# Patient Record
Sex: Male | Born: 1955 | Race: White | Hispanic: No | Marital: Married | Smoking: Never smoker
Health system: Southern US, Community
[De-identification: ages and names within clinical notes are randomized; demographics above are authoritative.]

## PROBLEM LIST (undated history)

## (undated) DIAGNOSIS — T7840XA Allergy, unspecified, initial encounter: Secondary | ICD-10-CM

## (undated) DIAGNOSIS — H409 Unspecified glaucoma: Secondary | ICD-10-CM

## (undated) DIAGNOSIS — C61 Malignant neoplasm of prostate: Secondary | ICD-10-CM

## (undated) DIAGNOSIS — E785 Hyperlipidemia, unspecified: Secondary | ICD-10-CM

## (undated) HISTORY — DX: Hyperlipidemia, unspecified: E78.5

## (undated) HISTORY — DX: Allergy, unspecified, initial encounter: T78.40XA

## (undated) HISTORY — DX: Unspecified glaucoma: H40.9

---

## 2004-03-02 ENCOUNTER — Ambulatory Visit (HOSPITAL_COMMUNITY): Admission: RE | Admit: 2004-03-02 | Discharge: 2004-03-03 | Payer: Self-pay | Admitting: Neurosurgery

## 2004-03-02 HISTORY — PX: BACK SURGERY: SHX140

## 2009-01-20 ENCOUNTER — Emergency Department: Payer: Self-pay | Admitting: Emergency Medicine

## 2010-01-15 HISTORY — PX: GLAUCOMA SURGERY: SHX656

## 2011-12-21 ENCOUNTER — Ambulatory Visit: Payer: Self-pay | Admitting: Internal Medicine

## 2012-10-14 ENCOUNTER — Ambulatory Visit (INDEPENDENT_AMBULATORY_CARE_PROVIDER_SITE_OTHER): Payer: BC Managed Care – PPO | Admitting: Internal Medicine

## 2012-10-14 ENCOUNTER — Encounter: Payer: Self-pay | Admitting: Internal Medicine

## 2012-10-14 VITALS — BP 120/80 | HR 53 | Temp 97.8°F | Ht 72.0 in | Wt 195.8 lb

## 2012-10-14 DIAGNOSIS — Z9109 Other allergy status, other than to drugs and biological substances: Secondary | ICD-10-CM

## 2012-10-14 DIAGNOSIS — H409 Unspecified glaucoma: Secondary | ICD-10-CM

## 2012-10-14 DIAGNOSIS — Z23 Encounter for immunization: Secondary | ICD-10-CM

## 2012-10-14 DIAGNOSIS — Z13220 Encounter for screening for lipoid disorders: Secondary | ICD-10-CM

## 2012-10-14 NOTE — Progress Notes (Signed)
  Subjective:    [name], male    DOB: 21-Sep-1955, 57 y.o.   MRN: [id number]  HPI 57 year old male with past history of glaucoma and allergies who comes in today to follow up on these issues as well as to establish care.  Previously seeing Dr Alison Murray.  Overall he states he is doing well.  No cardiac symptoms with increased activity or exertion.  Currently being treated for a sinus infection.  On amoxicillin.  Feels better.  No cough or congestion.  No sob.  No acid reflux.  Bowels stable.  Overall feels he is doing well.     Past Medical History  Diagnosis Date  . Glaucoma   . Allergy     Outpatient Encounter Prescriptions as of 10/14/2012  Medication Sig Dispense Refill  . amoxicillin (AMOXIL) 875 MG tablet Take 875 mg by mouth 2 (two) times daily.      . dorzolamide (TRUSOPT) 2 % ophthalmic solution Place 1 drop into both eyes 2 (two) times daily.      . fexofenadine (ALLEGRA) 180 MG tablet Take 180 mg by mouth daily.      Marland Kitchen LATANOPROST OP Apply 1 drop to eye at bedtime. Both eyes       No facility-administered encounter medications on file as of 10/14/2012.    Review of Systems Patient denies any headache, lightheadedness or dizziness.  Sinus infection improved.  No chest pain, tightness or palpitations.  No increased shortness of breath, cough or congestion.  No nausea or vomiting.  No acid reflux.  No abdominal pain or cramping.  No bowel change, such as diarrhea, constipation, BRBPR or melana.  No urine change.  Follows with Dr Inez Pilgrim for his glaucoma.       Objective:   Physical Exam Filed Vitals:   10/14/12 1327  BP: 120/80  Pulse: 53  Temp: 97.8 F (42.3 C)   57 year old male in no acute distress.  HEENT:  Nares - clear.  Oropharynx - without lesions. NECK:  Supple.  Nontender.  No audible carotid bruit.  HEART:  Appears to be regular.   LUNGS:  No crackles or wheezing audible.  Respirations even and unlabored.   RADIAL PULSE:  Equal  bilaterally.  ABDOMEN:  Soft.  Nontender.  Bowel sounds present and normal.  No audible abdominal bruit.  GU:  Performed by Dr Evelene Croon.    EXTREMITIES:  No increased edema present.  DP pulses palpable and equal bilaterally.       Assessment & Plan:  MSK.  Previous left leg pain.  S/p back surgery.  Doing well.  Follow.   HEALTH MAINTENANCE.  Gets his prostate checks and psa checks through Dr [facility] office.  Colonoscopy 1/14 - ok.  Check cholesterol.  Flu shot given today.    I spent 45 minutes with the patient and more than 50% of the time was spent in consultation regarding the above.

## 2012-10-15 DIAGNOSIS — H409 Unspecified glaucoma: Secondary | ICD-10-CM | POA: Insufficient documentation

## 2012-10-15 DIAGNOSIS — Z9109 Other allergy status, other than to drugs and biological substances: Secondary | ICD-10-CM | POA: Insufficient documentation

## 2012-10-15 NOTE — Assessment & Plan Note (Signed)
Currently being treated for a sinus infection.  Doing well.  Follow.

## 2012-10-15 NOTE — Assessment & Plan Note (Signed)
Is followed by opthalmology.  On eye drops.  Doing well.  Follow.   

## 2012-10-22 ENCOUNTER — Encounter: Payer: Self-pay | Admitting: Internal Medicine

## 2012-10-22 DIAGNOSIS — N400 Enlarged prostate without lower urinary tract symptoms: Secondary | ICD-10-CM | POA: Insufficient documentation

## 2012-10-29 ENCOUNTER — Other Ambulatory Visit (INDEPENDENT_AMBULATORY_CARE_PROVIDER_SITE_OTHER): Payer: BC Managed Care – PPO

## 2012-10-29 DIAGNOSIS — Z13220 Encounter for screening for lipoid disorders: Secondary | ICD-10-CM

## 2012-10-29 DIAGNOSIS — H409 Unspecified glaucoma: Secondary | ICD-10-CM

## 2012-10-29 LAB — CBC WITH DIFFERENTIAL/PLATELET
Basophils Absolute: 0 10*3/uL (ref 0.0–0.1)
Eosinophils Absolute: 0.2 10*3/uL (ref 0.0–0.7)
Lymphocytes Relative: 22.4 % (ref 12.0–46.0)
MCHC: 34.2 g/dL (ref 30.0–36.0)
MCV: 94.1 fl (ref 78.0–100.0)
Monocytes Absolute: 0.4 10*3/uL (ref 0.1–1.0)
Neutrophils Relative %: 69 % (ref 43.0–77.0)
Platelets: 201 10*3/uL (ref 150.0–400.0)
RBC: 4.23 Mil/uL (ref 4.22–5.81)
RDW: 12.6 % (ref 11.5–14.6)

## 2012-10-29 LAB — COMPREHENSIVE METABOLIC PANEL
AST: 29 U/L (ref 0–37)
Albumin: 4.1 g/dL (ref 3.5–5.2)
BUN: 18 mg/dL (ref 6–23)
CO2: 28 mEq/L (ref 19–32)
Calcium: 9.2 mg/dL (ref 8.4–10.5)
Chloride: 105 mEq/L (ref 96–112)
Creatinine, Ser: 0.9 mg/dL (ref 0.4–1.5)
GFR: 90.52 mL/min (ref >60.00)
Potassium: 3.7 mEq/L (ref 3.5–5.1)

## 2012-10-29 LAB — LIPID PANEL
Cholesterol: 195 mg/dL (ref 0–200)
LDL Cholesterol: 136 mg/dL — ABNORMAL HIGH (ref 0–99)
Triglycerides: 60 mg/dL (ref 0.0–149.0)

## 2012-10-29 LAB — TSH: TSH: 1.44 u[IU]/mL (ref 0.35–5.50)

## 2012-11-04 ENCOUNTER — Encounter: Payer: Self-pay | Admitting: Internal Medicine

## 2012-11-06 NOTE — Telephone Encounter (Signed)
Mailed unread message to pt  

## 2012-12-19 ENCOUNTER — Telehealth: Payer: Self-pay | Admitting: Internal Medicine

## 2012-12-19 ENCOUNTER — Emergency Department: Payer: Self-pay | Admitting: Emergency Medicine

## 2012-12-19 LAB — CBC WITH DIFFERENTIAL/PLATELET
Basophil %: 0.9 %
Eosinophil %: 4 %
Lymphocyte %: 28.3 %
MCHC: 34.4 g/dL (ref 32.0–36.0)
Monocyte %: 9.2 %
Platelet: 183 10*3/uL (ref 150–440)
RBC: 4.4 10*6/uL (ref 4.40–5.90)
WBC: 7.5 10*3/uL (ref 3.8–10.6)

## 2012-12-19 LAB — COMPREHENSIVE METABOLIC PANEL
Bilirubin,Total: 0.3 mg/dL (ref 0.2–1.0)
Co2: 29 mmol/L (ref 21–32)
Creatinine: 1.08 mg/dL (ref 0.60–1.30)
EGFR (Non-African Amer.): >60
SGOT(AST): 26 U/L (ref 15–37)
SGPT (ALT): 54 U/L (ref 12–78)
Sodium: 139 mmol/L (ref 136–145)

## 2012-12-19 NOTE — Telephone Encounter (Signed)
Pt spouse called to get er follow up Pt went early am on 12/5 for dizziness  Er wanted 2 day follow up Please advise

## 2012-12-19 NOTE — Telephone Encounter (Signed)
Spoke with wife Jeremy Fischer)- states that Jeremy Fischer is doing well & was sent home with medication. I informed her that we will be calling back soon with an appt. (will request ER records on Monday)

## 2012-12-19 NOTE — Telephone Encounter (Signed)
Will await ER records and review and then see if can find a work in spot for him.

## 2012-12-22 NOTE — Telephone Encounter (Signed)
Records received & in your folder 

## 2012-12-22 NOTE — Telephone Encounter (Signed)
Records requested

## 2012-12-24 NOTE — Telephone Encounter (Signed)
Left voicemail to inform patient of appointment- Friday 12/12 @ 11:45. (sent mychart also)

## 2012-12-26 ENCOUNTER — Ambulatory Visit (INDEPENDENT_AMBULATORY_CARE_PROVIDER_SITE_OTHER): Payer: BC Managed Care – PPO | Admitting: Internal Medicine

## 2012-12-26 ENCOUNTER — Encounter: Payer: Self-pay | Admitting: Internal Medicine

## 2012-12-26 VITALS — BP 126/62 | HR 54 | Temp 98.4°F | Resp 12 | Ht 72.0 in | Wt 202.0 lb

## 2012-12-26 DIAGNOSIS — R42 Dizziness and giddiness: Secondary | ICD-10-CM

## 2012-12-26 NOTE — Progress Notes (Signed)
Pre visit review using our clinic review tool, if applicable. No additional management support is needed unless otherwise documented below in the visit note. 

## 2012-12-28 ENCOUNTER — Encounter: Payer: Self-pay | Admitting: Internal Medicine

## 2012-12-28 DIAGNOSIS — R42 Dizziness and giddiness: Secondary | ICD-10-CM | POA: Insufficient documentation

## 2012-12-28 NOTE — Assessment & Plan Note (Signed)
ER evaluation as outlined. Symptoms have completely resolved.  W/up as outlined.  Will hold on further w/up at this time.  Follow.

## 2012-12-28 NOTE — Progress Notes (Signed)
  Subjective:    [name], male    DOB: [birth year], 57 y.o.   MRN: [id number]  HPI 57 year old male with past history of glaucoma and allergies who comes in today for an ER follow up.  States he was at work 12/19/12.  Was driving a forklift.  Started becoming dizzy.  Stopped the forklift and lowered himself to the ground.  States room was spinning.  Acute onset.  No precipitating factors.  No chest pain or tightness.  No sob.  No nausea or vomiting.  No bowel change.  Went to ER.  EKG and CT unrevealing.  Labs unrevealing.  Diagnosed with vertigo.  Given meclizine.  Since the visit in the ER, symptoms have resolved.   Overall he states he is doing well now.   No cardiac symptoms with increased activity or exertion.   No cough or congestion.  No sob.  No acid reflux.  Bowels stable.  No dizziness.     Past Medical History  Diagnosis Date  . Glaucoma   . Allergy     Outpatient Encounter Prescriptions as of 12/26/2012  Medication Sig  . dorzolamide-timolol (COSOPT) 22.3-6.8 MG/ML ophthalmic solution   . fexofenadine (ALLEGRA) 180 MG tablet Take 180 mg by mouth daily.  Marland Kitchen LATANOPROST OP Apply 1 drop to eye at bedtime. Both eyes  . meclizine (ANTIVERT) 25 MG tablet Take 25 mg by mouth 2 (two) times daily.   Marland Kitchen NASONEX 50 MCG/ACT nasal spray Place 2 sprays into the nose at bedtime as needed.   . [DISCONTINUED] amoxicillin (AMOXIL) 875 MG tablet Take 875 mg by mouth 2 (two) times daily.  . [DISCONTINUED] dorzolamide (TRUSOPT) 2 % ophthalmic solution Place 1 drop into both eyes 2 (two) times daily.    Review of Systems Patient denies any headache, lightheadedness or dizziness now.  No sinus congestion or drainage.  No chest pain, tightness or palpitations.  No increased shortness of breath, cough or congestion.  No nausea or vomiting.  No acid reflux.  No abdominal pain or cramping.  No bowel change, such as diarrhea, constipation, BRBPR or melana.  No urine change.  Follows with Dr  Inez Pilgrim for his glaucoma.  No vision change.  No other focal neurological changes.  Overall he feels back to his baseline.        Objective:   Physical Exam  Filed Vitals:   12/26/12 1200  BP: 126/62  Pulse: 54  Temp: 98.4 F (36.9 C)  Resp: 35   57 year old male in no acute distress.  HEENT:  Nares - clear.  Oropharynx - without lesions. NECK:  Supple.  Nontender.  No audible carotid bruit.  HEART:  Appears to be regular.   LUNGS:  No crackles or wheezing audible.  Respirations even and unlabored.   RADIAL PULSE:  Equal bilaterally.  ABDOMEN:  Soft.  Nontender.  Bowel sounds present and normal.  No audible abdominal bruit.     EXTREMITIES:  No increased edema present.  DP pulses palpable and equal bilaterally.       Assessment & Plan:  HEALTH MAINTENANCE.  Gets his prostate checks and psa checks through Dr [facility] office.  Colonoscopy 1/14 - ok.    I spent 25 minutes with the patient and more than 50% of the time was spent in consultation regarding the above.

## 2013-04-14 ENCOUNTER — Encounter: Payer: Self-pay | Admitting: Internal Medicine

## 2013-04-14 ENCOUNTER — Ambulatory Visit (INDEPENDENT_AMBULATORY_CARE_PROVIDER_SITE_OTHER): Payer: BC Managed Care – PPO | Admitting: Internal Medicine

## 2013-04-14 VITALS — BP 130/80 | HR 56 | Temp 98.3°F | Ht 72.0 in | Wt 203.2 lb

## 2013-04-14 DIAGNOSIS — E7800 Pure hypercholesterolemia, unspecified: Secondary | ICD-10-CM

## 2013-04-14 DIAGNOSIS — R42 Dizziness and giddiness: Secondary | ICD-10-CM

## 2013-04-14 DIAGNOSIS — R739 Hyperglycemia, unspecified: Secondary | ICD-10-CM

## 2013-04-14 DIAGNOSIS — H409 Unspecified glaucoma: Secondary | ICD-10-CM

## 2013-04-14 DIAGNOSIS — R7309 Other abnormal glucose: Secondary | ICD-10-CM

## 2013-04-14 DIAGNOSIS — N400 Enlarged prostate without lower urinary tract symptoms: Secondary | ICD-10-CM

## 2013-04-14 DIAGNOSIS — Z9109 Other allergy status, other than to drugs and biological substances: Secondary | ICD-10-CM

## 2013-04-14 NOTE — Progress Notes (Signed)
Pre-visit discussion using our clinic review tool. No additional management support is needed unless otherwise documented below in the visit note.  

## 2013-04-14 NOTE — Progress Notes (Signed)
  Subjective:    [name], male    DOB: [birth year], 58 y.o.   MRN: [id number]  HPI 58 year old male with past history of glaucoma and allergies who comes in today for a scheduled follow up.  Feels he is doing well.  Has had no more dizziness.   No chest pain or tightness.  No sob.  No nausea or vomiting.  No bowel change. No cardiac symptoms with increased activity or exertion.   No cough or congestion.   No acid reflux.  Bowels stable.  Overall feels good.    Past Medical History  Diagnosis Date  . Glaucoma   . Allergy     Outpatient Encounter Prescriptions as of 04/14/2013  Medication Sig  . dorzolamide-timolol (COSOPT) 22.3-6.8 MG/ML ophthalmic solution   . fexofenadine (ALLEGRA) 180 MG tablet Take 180 mg by mouth daily.  Marland Kitchen LATANOPROST OP Apply 1 drop to eye at bedtime. Both eyes  . NASONEX 50 MCG/ACT nasal spray Place 2 sprays into the nose at bedtime as needed.     Review of Systems Patient denies any headache, lightheadedness or dizziness.  No sinus congestion or drainage.  No chest pain, tightness or palpitations.  No increased shortness of breath, cough or congestion.  No nausea or vomiting.  No acid reflux.  No abdominal pain or cramping.  No bowel change, such as diarrhea, constipation, BRBPR or melana.  No urine change.  Follows with Dr Wallace Going for his glaucoma.  No vision change.        Objective:   Physical Exam  Filed Vitals:   04/14/13 1407  BP: 130/80  Pulse: 56  Temp: 98.3 F (36.8 C)   Pulse recheck 76  58 year old male in no acute distress.  HEENT:  Nares - clear.  Oropharynx - without lesions. NECK:  Supple.  Nontender.  No audible carotid bruit.  HEART:  Appears to be regular.   LUNGS:  No crackles or wheezing audible.  Respirations even and unlabored.   RADIAL PULSE:  Equal bilaterally.  ABDOMEN:  Soft.  Nontender.  Bowel sounds present and normal.  No audible abdominal bruit.     EXTREMITIES:  No increased edema present.  DP  pulses palpable and equal bilaterally.       Assessment & Plan:  HEALTH MAINTENANCE.  Gets his prostate checks and psa checks through Dr [facility] office.  Colonoscopy 1/14 - ok.

## 2013-04-19 ENCOUNTER — Encounter: Payer: Self-pay | Admitting: Internal Medicine

## 2013-04-19 NOTE — Assessment & Plan Note (Signed)
Is followed by opthalmology.  On eye drops.  Doing well.  Follow.   

## 2013-04-19 NOTE — Assessment & Plan Note (Signed)
Doing well.  Follow.  

## 2013-04-19 NOTE — Assessment & Plan Note (Signed)
Sees urology.  Follow.

## 2013-04-19 NOTE — Assessment & Plan Note (Signed)
No further episodes.  Follow.  Currently doing well.   

## 2013-05-04 ENCOUNTER — Ambulatory Visit (INDEPENDENT_AMBULATORY_CARE_PROVIDER_SITE_OTHER): Payer: BC Managed Care – PPO | Admitting: Adult Health

## 2013-05-04 ENCOUNTER — Encounter: Payer: Self-pay | Admitting: Adult Health

## 2013-05-04 VITALS — BP 124/76 | HR 73 | Temp 98.2°F | Resp 14 | Wt 203.0 lb

## 2013-05-04 DIAGNOSIS — J329 Chronic sinusitis, unspecified: Secondary | ICD-10-CM

## 2013-05-04 MED ORDER — AMOXICILLIN-POT CLAVULANATE 875-125 MG PO TABS: 1.0000 | ORAL_TABLET | Freq: Two times a day (BID) | ORAL | Status: DC

## 2013-05-04 NOTE — Progress Notes (Signed)
   Subjective:    [name], male    DOB: [birth year], 58 y.o.   MRN: [id number]  HPI  Pt is a 58 y/o male who presents to clinic with sinus pressure, HA, congestion since Monday. He has been taking Allegra.  Review of Systems  Constitutional: Positive for fever and chills.  HENT: Positive for congestion, postnasal drip, rhinorrhea, sinus pressure and sore throat.   Respiratory: Positive for cough. Negative for shortness of breath and wheezing.   Gastrointestinal: Negative.   Neurological: Positive for headaches.  All other systems reviewed and are negative.      Objective:   Physical Exam  Constitutional: He is oriented to person, place, and time.  Appears acutely ill  HENT:  Head: Normocephalic and atraumatic.  Right Ear: External ear normal.  Left Ear: External ear normal.  Mouth/Throat: No oropharyngeal exudate.  Pharyngeal erythema  Eyes: Conjunctivae and EOM are normal.  Neck: Normal range of motion. Neck supple.  Cardiovascular: Normal rate, regular rhythm and normal heart sounds.  Exam reveals no gallop and no friction rub.   No murmur heard. Pulmonary/Chest: Effort normal and breath sounds normal. No respiratory distress. He has no wheezes. He has no rales.  Musculoskeletal: Normal range of motion.  Lymphadenopathy:    He has cervical adenopathy.  Neurological: He is alert and oriented to person, place, and time.  Skin: Skin is warm and dry.  Psychiatric: He has a normal mood and affect. His behavior is normal. Judgment and thought content normal.      Assessment & Plan:   1. Sinusitis Augmentin twice a day x7 days. Tylenol for fever. Still some for cough. Children's Dimetapp for congestion. May use Afrin is severely congested x3 days. Continue Allegra and Nasonex. RTC if symptoms are not improved within 4-5 days.

## 2013-05-04 NOTE — Progress Notes (Signed)
Pre visit review using our clinic review tool, if applicable. No additional management support is needed unless otherwise documented below in the visit note. 

## 2013-05-04 NOTE — Patient Instructions (Signed)
  Start Augmentin twice a day for 7 days.  Continue Allegra daily as well as Nasonex daily.  Drink plenty of water to maintain hydration.  Tylenol for fever.  Delsym with guaifenesin and dextromethorphan for cough  Children's Dimetapp for congestion  Use saline nasal spray to irrigate sinus. You may use this as often as you like.  Return to clinic if your symptoms do not improve within 4-5 days or sooner if necessary.

## 2013-05-06 ENCOUNTER — Telehealth: Payer: Self-pay | Admitting: Internal Medicine

## 2013-05-06 ENCOUNTER — Ambulatory Visit: Payer: Self-pay | Admitting: Family Medicine

## 2013-05-06 NOTE — Telephone Encounter (Signed)
Patient Information:  Caller Name: Evette Cristal  Phone: [phone]  Patient: [place], [place]  Gender: Male  DOB: [birth year]  Age: 58 Years  PCP: Einar Pheasant  Office Follow Up:  Does the office need to follow up with this patient?: Yes  Instructions For The Office: Please contact wife to give further instructions.  RN Note:  No appointments available today in [place] office. Also checked [facility] location, but no appointments were available there this afternoon. Offered to schedule appointment for tomorrow, but caller requested a message be sent to the provider to get further instructions.  Symptoms  Reason For Call & Symptoms: Patient was diagnosed with sinus infection in office on 05/04/13. Has been taking antibiotic as prescribed. Now having neck pain with movement. Patient is able to touch chin to chest with no pain. Only has pain when moving head side to side. Reports swollen area at the base of the neck noted.  Reviewed Health History In EMR: Yes  Reviewed Medications In EMR: Yes  Reviewed Allergies In EMR: Yes  Reviewed Surgeries / Procedures: Yes  Date of Onset of Symptoms: 05/05/2013  Treatments Tried: Tylenol  Treatments Tried Worked: No  Guideline(s) Used:  Neck Pain or Stiffness  Disposition Per Guideline:   See Within 3 Days in Office  Reason For Disposition Reached:   Moderate neck pain (e.g., interferes with normal activities like work or school)  Advice Given:  N/A  Patient Will Follow Care Advice:  YES

## 2013-05-06 NOTE — Telephone Encounter (Signed)
Since this is a new symptom, I agree with need for evaluation.  If any acute issues, eval today otherwise I can work him in for this tomorrow am (10:00 opening).

## 2013-05-06 NOTE — Telephone Encounter (Signed)
Spoke with patient & he states that he will wait to be seen tomorrow at 10am

## 2013-05-06 NOTE — Telephone Encounter (Signed)
Please advise 

## 2013-05-07 ENCOUNTER — Encounter: Payer: Self-pay | Admitting: Internal Medicine

## 2013-05-07 ENCOUNTER — Ambulatory Visit (INDEPENDENT_AMBULATORY_CARE_PROVIDER_SITE_OTHER): Payer: BC Managed Care – PPO | Admitting: Internal Medicine

## 2013-05-07 VITALS — BP 122/70 | HR 67 | Temp 97.9°F | Ht 72.0 in | Wt 202.2 lb

## 2013-05-07 DIAGNOSIS — R220 Localized swelling, mass and lump, head: Secondary | ICD-10-CM

## 2013-05-07 DIAGNOSIS — J329 Chronic sinusitis, unspecified: Secondary | ICD-10-CM

## 2013-05-07 DIAGNOSIS — R739 Hyperglycemia, unspecified: Secondary | ICD-10-CM

## 2013-05-07 DIAGNOSIS — R7309 Other abnormal glucose: Secondary | ICD-10-CM

## 2013-05-07 DIAGNOSIS — M542 Cervicalgia: Secondary | ICD-10-CM

## 2013-05-07 DIAGNOSIS — R221 Localized swelling, mass and lump, neck: Secondary | ICD-10-CM

## 2013-05-07 LAB — BASIC METABOLIC PANEL
BUN: 17 mg/dL (ref 6–23)
CHLORIDE: 101 meq/L (ref 96–112)
CO2: 28 meq/L (ref 19–32)
CREATININE: 0.9 mg/dL (ref 0.4–1.5)
Calcium: 9.6 mg/dL (ref 8.4–10.5)
GFR: 96.37 mL/min (ref >60.00)
Glucose, Bld: 96 mg/dL (ref 70–99)
POTASSIUM: 3.8 meq/L (ref 3.5–5.1)
Sodium: 139 mEq/L (ref 135–145)

## 2013-05-07 LAB — HEMOGLOBIN A1C: HEMOGLOBIN A1C: 5.5 % (ref 4.6–6.5)

## 2013-05-07 NOTE — Progress Notes (Signed)
Pre visit review using our clinic review tool, if applicable. No additional management support is needed unless otherwise documented below in the visit note. 

## 2013-05-10 ENCOUNTER — Encounter: Payer: Self-pay | Admitting: Internal Medicine

## 2013-05-10 DIAGNOSIS — R9389 Abnormal findings on diagnostic imaging of other specified body structures: Secondary | ICD-10-CM | POA: Insufficient documentation

## 2013-05-10 DIAGNOSIS — R739 Hyperglycemia, unspecified: Secondary | ICD-10-CM | POA: Insufficient documentation

## 2013-05-10 NOTE — Assessment & Plan Note (Signed)
This appears to be more msk in nature.  Pulling sensation with rotation of his head.  Appears to be unrelated to the neck fullness.  Is getting better.  Tylenol as directed.  Follow.

## 2013-05-10 NOTE — Progress Notes (Signed)
Subjective:    [name], male    DOB: 01/15/1956, 58 y.o.   MRN: [id number]  Neck Pain   58 year old male with past history of glaucoma and allergies who comes in today as a work in with concerns regarding some neck pain and neck fullness.   He saw Raquel recently for sinus infection.  See her note for details.  She placed him on augmentin.  The sinus infection appears to be improved.  He states that evening, he noticed some neck soreness and then it felt like he had a crick in his neck.  Pulls when he turns his head from left to right.  Was seen at urgent care yesterday.  They prescribed him a pain pill for his neck.  He did not get it filled.  States he prefers not to take pain medication.  Neck is better today.  Overall feels better.  Had subjective fever initially with infection, but this has resolved.  No headache.  Sinus better.  Neck pain better.  He came in today because he noticed increased fullness just above the clavicle - lower lateral neck.  Has had no more dizziness.   No chest pain or tightness.  No sob.  No nausea or vomiting.  No bowel change. No cardiac symptoms with increased activity or exertion.   No cough or congestion.   No acid reflux.  Bowels stable.     Past Medical History  Diagnosis Date  . Glaucoma   . Allergy     Outpatient Encounter Prescriptions as of 05/07/2013  Medication Sig  . amoxicillin-clavulanate (AUGMENTIN) 875-125 MG per tablet Take 1 tablet by mouth 2 (two) times daily.  . dorzolamide-timolol (COSOPT) 22.3-6.8 MG/ML ophthalmic solution   . fexofenadine (ALLEGRA) 180 MG tablet Take 180 mg by mouth daily.  Marland Kitchen LATANOPROST OP Apply 1 drop to eye at bedtime. Both eyes  . NASONEX 50 MCG/ACT nasal spray Place 2 sprays into the nose at bedtime as needed.     Review of Systems  Musculoskeletal: Positive for neck pain.  Patient denies any headache, lightheadedness or dizziness.  Sinus symptoms improved.  No chest pain, tightness or  palpitations.  No increased shortness of breath, cough or congestion.  No nausea or vomiting.  No acid reflux.  No abdominal pain or cramping.  No bowel change, such as diarrhea.  Increased discomfort in his neck with rotation of his head.  Improving.  Increased fullness as outlined.  No pain over this area.       Objective:   Physical Exam  Filed Vitals:   05/07/13 1020  BP: 122/70  Pulse: 67  Temp: 97.9 F (31.44 C)   58 year old male in no acute distress.  HEENT:  Nares - clear.  Oropharynx - without lesions. NECK:  Supple.  Nontender to palpation.   No audible carotid bruit.  Increased soft tissue fullness above both clavicles/base anterior neck.  Left - more fullness.  No pain to palpation.  No increased erythema or warmth.  No neck rigidity.   HEART:  Appears to be regular.   LUNGS:  No crackles or wheezing audible.  Respirations even and unlabored.   RADIAL PULSE:  Equal bilaterally.   MSK:  Increased discomfort and pulling sensation right neck with rotation of his head from right to left.       Assessment & Plan:  HEALTH MAINTENANCE.  Gets his prostate checks and psa checks through Dr [facility] office.  Colonoscopy  1/14 - ok.

## 2013-05-10 NOTE — Assessment & Plan Note (Signed)
Noted on last lab check.  Check met b and a1c today.

## 2013-05-10 NOTE — Assessment & Plan Note (Signed)
Treated with augmentin.  Better.  Follow.

## 2013-05-10 NOTE — Assessment & Plan Note (Signed)
Exam as outlined.  Increased soft tissue fullness left lower anterior neck just above the clavicle.  No pain.  Will CT neck to confirm no other acute abnormality.

## 2013-05-11 ENCOUNTER — Ambulatory Visit: Payer: Self-pay | Admitting: Internal Medicine

## 2013-05-14 ENCOUNTER — Ambulatory Visit: Payer: Self-pay | Admitting: Oncology

## 2013-05-14 LAB — COMPREHENSIVE METABOLIC PANEL
Albumin: 3.9 g/dL (ref 3.4–5.0)
Alkaline Phosphatase: 81 U/L
Anion Gap: 7 (ref 7–16)
BILIRUBIN TOTAL: 0.3 mg/dL (ref 0.2–1.0)
BUN: 21 mg/dL — ABNORMAL HIGH (ref 7–18)
CALCIUM: 9.2 mg/dL (ref 8.5–10.1)
Chloride: 103 mmol/L (ref 98–107)
Co2: 31 mmol/L (ref 21–32)
Creatinine: 0.98 mg/dL (ref 0.60–1.30)
EGFR (African American): >60
GLUCOSE: 96 mg/dL (ref 65–99)
Osmolality: 284 (ref 275–301)
Potassium: 3.9 mmol/L (ref 3.5–5.1)
SGOT(AST): 20 U/L (ref 15–37)
SGPT (ALT): 41 U/L (ref 12–78)
SODIUM: 141 mmol/L (ref 136–145)
TOTAL PROTEIN: 7.6 g/dL (ref 6.4–8.2)

## 2013-05-14 LAB — CBC CANCER CENTER
BASOS ABS: 0.1 x10 3/mm (ref 0.0–0.1)
Basophil %: 1 %
EOS ABS: 0.6 x10 3/mm (ref 0.0–0.7)
Eosinophil %: 7 %
HCT: 40.5 % (ref 40.0–52.0)
HGB: 14.2 g/dL (ref 13.0–18.0)
LYMPHS ABS: 2.4 x10 3/mm (ref 1.0–3.6)
LYMPHS PCT: 26.4 %
MCH: 32.1 pg (ref 26.0–34.0)
MCHC: 35 g/dL (ref 32.0–36.0)
MCV: 92 fL (ref 80–100)
Monocyte #: 0.6 x10 3/mm (ref 0.2–1.0)
Monocyte %: 7 %
Neutrophil #: 5.2 x10 3/mm (ref 1.4–6.5)
Neutrophil %: 58.6 %
PLATELETS: 224 x10 3/mm (ref 150–440)
RBC: 4.43 10*6/uL (ref 4.40–5.90)
RDW: 12.5 % (ref 11.5–14.5)
WBC: 8.9 x10 3/mm (ref 3.8–10.6)

## 2013-05-14 LAB — LACTATE DEHYDROGENASE: LDH: 168 U/L (ref 85–241)

## 2013-05-15 ENCOUNTER — Ambulatory Visit: Payer: Self-pay | Admitting: Oncology

## 2013-05-19 ENCOUNTER — Ambulatory Visit: Payer: Self-pay | Admitting: Oncology

## 2013-06-15 ENCOUNTER — Ambulatory Visit: Payer: Self-pay | Admitting: Oncology

## 2013-08-24 ENCOUNTER — Ambulatory Visit: Payer: Self-pay | Admitting: Oncology

## 2013-08-24 LAB — COMPREHENSIVE METABOLIC PANEL
ANION GAP: 8 (ref 7–16)
Albumin: 3.9 g/dL (ref 3.4–5.0)
Alkaline Phosphatase: 78 U/L
BUN: 17 mg/dL (ref 7–18)
Bilirubin,Total: 0.4 mg/dL (ref 0.2–1.0)
Calcium, Total: 8.9 mg/dL (ref 8.5–10.1)
Chloride: 102 mmol/L (ref 98–107)
Co2: 30 mmol/L (ref 21–32)
Creatinine: 1.09 mg/dL (ref 0.60–1.30)
EGFR (African American): >60
EGFR (Non-African Amer.): >60
GLUCOSE: 110 mg/dL — AB (ref 65–99)
Osmolality: 282 (ref 275–301)
Potassium: 3.8 mmol/L (ref 3.5–5.1)
SGOT(AST): 21 U/L (ref 15–37)
SGPT (ALT): 38 U/L
SODIUM: 140 mmol/L (ref 136–145)
Total Protein: 7.5 g/dL (ref 6.4–8.2)

## 2013-08-24 LAB — CBC CANCER CENTER
Basophil #: 0.1 x10 3/mm (ref 0.0–0.1)
Basophil %: 0.9 %
EOS ABS: 0.3 x10 3/mm (ref 0.0–0.7)
EOS PCT: 3.9 %
HCT: 43 % (ref 40.0–52.0)
HGB: 14.5 g/dL (ref 13.0–18.0)
LYMPHS ABS: 1.4 x10 3/mm (ref 1.0–3.6)
LYMPHS PCT: 22 %
MCH: 31.9 pg (ref 26.0–34.0)
MCHC: 33.6 g/dL (ref 32.0–36.0)
MCV: 95 fL (ref 80–100)
MONO ABS: 0.5 x10 3/mm (ref 0.2–1.0)
Monocyte %: 7.9 %
Neutrophil #: 4.3 x10 3/mm (ref 1.4–6.5)
Neutrophil %: 65.3 %
PLATELETS: 197 x10 3/mm (ref 150–440)
RBC: 4.53 10*6/uL (ref 4.40–5.90)
RDW: 12.7 % (ref 11.5–14.5)
WBC: 6.6 x10 3/mm (ref 3.8–10.6)

## 2013-09-02 ENCOUNTER — Encounter: Payer: Self-pay | Admitting: Internal Medicine

## 2013-09-15 ENCOUNTER — Ambulatory Visit: Payer: Self-pay | Admitting: Oncology

## 2013-10-16 ENCOUNTER — Ambulatory Visit (INDEPENDENT_AMBULATORY_CARE_PROVIDER_SITE_OTHER): Payer: BC Managed Care – PPO | Admitting: Internal Medicine

## 2013-10-16 ENCOUNTER — Encounter: Payer: Self-pay | Admitting: Internal Medicine

## 2013-10-16 VITALS — BP 120/78 | HR 52 | Temp 98.0°F | Ht 71.5 in | Wt 204.5 lb

## 2013-10-16 DIAGNOSIS — Z23 Encounter for immunization: Secondary | ICD-10-CM

## 2013-10-16 DIAGNOSIS — R938 Abnormal findings on diagnostic imaging of other specified body structures: Secondary | ICD-10-CM

## 2013-10-16 DIAGNOSIS — Z91048 Other nonmedicinal substance allergy status: Secondary | ICD-10-CM

## 2013-10-16 DIAGNOSIS — E7800 Pure hypercholesterolemia, unspecified: Secondary | ICD-10-CM

## 2013-10-16 DIAGNOSIS — N400 Enlarged prostate without lower urinary tract symptoms: Secondary | ICD-10-CM

## 2013-10-16 DIAGNOSIS — IMO0001 Reserved for inherently not codable concepts without codable children: Secondary | ICD-10-CM

## 2013-10-16 DIAGNOSIS — R42 Dizziness and giddiness: Secondary | ICD-10-CM

## 2013-10-16 DIAGNOSIS — H409 Unspecified glaucoma: Secondary | ICD-10-CM

## 2013-10-16 DIAGNOSIS — R030 Elevated blood-pressure reading, without diagnosis of hypertension: Secondary | ICD-10-CM

## 2013-10-16 DIAGNOSIS — R9389 Abnormal findings on diagnostic imaging of other specified body structures: Secondary | ICD-10-CM

## 2013-10-16 DIAGNOSIS — Z9109 Other allergy status, other than to drugs and biological substances: Secondary | ICD-10-CM

## 2013-10-16 DIAGNOSIS — R739 Hyperglycemia, unspecified: Secondary | ICD-10-CM

## 2013-10-16 NOTE — Progress Notes (Signed)
Pre visit review using our clinic review tool, if applicable. No additional management support is needed unless otherwise documented below in the visit note. 

## 2013-10-18 ENCOUNTER — Encounter: Payer: Self-pay | Admitting: Internal Medicine

## 2013-10-18 DIAGNOSIS — R030 Elevated blood-pressure reading, without diagnosis of hypertension: Secondary | ICD-10-CM | POA: Insufficient documentation

## 2013-10-18 NOTE — Assessment & Plan Note (Signed)
A1c 05/07/13 - wnl (5.5).

## 2013-10-18 NOTE — Progress Notes (Signed)
  Subjective:    [name]., male    DOB: 1955/09/28, 58 y.o.   MRN: [id number]  HPI 58 year old male with past history of glaucoma and allergies who comes in today for a scheduled follow up.  Was scheduled for a physical, but sees urology for his prostate checks.   Feels he is doing relatively well.  Has had no more dizziness.   No chest pain or tightness.  No sob.  No nausea or vomiting.  No bowel change. No cardiac symptoms with increased activity or exertion.   No cough or congestion.   No acid reflux.  Bowels stable.  Overall feels good.  He just saw Dr Oliva Bustard for abnormal CT.  Recommended f/u CT in 6 months.  Saw ENT as well.  States everything checked out fine.  Due to see Dr Yves Dill 12/15.  No urinary issues.     Past Medical History  Diagnosis Date  . Glaucoma   . Allergy     Outpatient Encounter Prescriptions as of 10/16/2013  Medication Sig  . dorzolamide-timolol (COSOPT) 22.3-6.8 MG/ML ophthalmic solution   . fexofenadine (ALLEGRA) 180 MG tablet Take 180 mg by mouth daily.  Marland Kitchen LATANOPROST OP Apply 1 drop to eye at bedtime. Both eyes  . NASONEX 50 MCG/ACT nasal spray Place 2 sprays into the nose at bedtime as needed.   . [DISCONTINUED] amoxicillin-clavulanate (AUGMENTIN) 875-125 MG per tablet Take 1 tablet by mouth 2 (two) times daily.    Review of Systems Patient denies any headache, lightheadedness or dizziness.  No sinus congestion or drainage.  No chest pain, tightness or palpitations.  No increased shortness of breath, cough or congestion.  No nausea or vomiting.  No acid reflux.  No abdominal pain or cramping.  No bowel change, such as diarrhea, constipation, BRBPR or melana.  No urine change.  Follows with Dr Wallace Going for his glaucoma.  No vision change.    No neck pain now.  Seeing Dr Oliva Bustard for abnormal CT.,  Recommended f/u in 6 months.       Objective:   Physical Exam  Filed Vitals:   10/16/13 1532  BP: 120/78  Pulse: 52  Temp: 98 F (36.7 C)    Pulse recheck 56, blood pressure recheck:  42/4  58 year old male in no acute distress.  HEENT:  Nares - clear.  Oropharynx - without lesions. NECK:  Supple.  Nontender.  No audible carotid bruit.  HEART:  Appears to be regular.   LUNGS:  No crackles or wheezing audible.  Respirations even and unlabored.   RADIAL PULSE:  Equal bilaterally.  ABDOMEN:  Soft.  Nontender.  Bowel sounds present and normal.  No audible abdominal bruit.  GU:  Performed by urology.      EXTREMITIES:  No increased edema present.  DP pulses palpable and equal bilaterally.       Assessment & Plan:  HEALTH MAINTENANCE.  Gets his prostate checks and psa checks through Dr [facility] office.  Colonoscopy 1/14 - ok.

## 2013-10-18 NOTE — Assessment & Plan Note (Signed)
Blood pressure as outlined.  He will spot check his pressure.  Notify me of readings.

## 2013-10-18 NOTE — Assessment & Plan Note (Signed)
Recent neck CT abnormal.  See report for details.  (multiple lymph nodes, etc).  Saw ENT. Everything checked out fine.  Seeing Dr Oliva Bustard.  Recommended f/u scan in 6 months.

## 2013-10-18 NOTE — Assessment & Plan Note (Signed)
Sees Dr Yves Dill.  States up to date with prostate checks and psa.  Due f/u in 01/2013.

## 2013-10-18 NOTE — Assessment & Plan Note (Signed)
Is followed by opthalmology.  On eye drops.  Doing well.  Follow.

## 2013-10-18 NOTE — Assessment & Plan Note (Signed)
Doing well.  Follow.  

## 2013-10-18 NOTE — Assessment & Plan Note (Signed)
No further episodes.  Follow.  Currently doing well.

## 2013-10-20 ENCOUNTER — Encounter: Payer: Self-pay | Admitting: Internal Medicine

## 2013-10-20 ENCOUNTER — Other Ambulatory Visit: Payer: Self-pay | Admitting: Internal Medicine

## 2013-10-20 ENCOUNTER — Other Ambulatory Visit (INDEPENDENT_AMBULATORY_CARE_PROVIDER_SITE_OTHER): Payer: BC Managed Care – PPO

## 2013-10-20 ENCOUNTER — Ambulatory Visit: Payer: Self-pay | Admitting: Family Medicine

## 2013-10-20 DIAGNOSIS — R7989 Other specified abnormal findings of blood chemistry: Secondary | ICD-10-CM

## 2013-10-20 DIAGNOSIS — E7800 Pure hypercholesterolemia, unspecified: Secondary | ICD-10-CM

## 2013-10-20 DIAGNOSIS — R739 Hyperglycemia, unspecified: Secondary | ICD-10-CM

## 2013-10-20 DIAGNOSIS — R9389 Abnormal findings on diagnostic imaging of other specified body structures: Secondary | ICD-10-CM

## 2013-10-20 DIAGNOSIS — R938 Abnormal findings on diagnostic imaging of other specified body structures: Secondary | ICD-10-CM

## 2013-10-20 LAB — HEPATIC FUNCTION PANEL
ALT: 39 U/L (ref 0–53)
AST: 28 U/L (ref 0–37)
Albumin: 4.3 g/dL (ref 3.5–5.2)
Alkaline Phosphatase: 71 U/L (ref 39–117)
BILIRUBIN DIRECT: 0.1 mg/dL (ref 0.0–0.3)
BILIRUBIN TOTAL: 0.5 mg/dL (ref 0.2–1.2)
Total Protein: 8 g/dL (ref 6.0–8.3)

## 2013-10-20 LAB — CBC WITH DIFFERENTIAL/PLATELET
BASOS PCT: 0.4 % (ref 0.0–3.0)
Basophils Absolute: 0 10*3/uL (ref 0.0–0.1)
EOS ABS: 0.4 10*3/uL (ref 0.0–0.7)
Eosinophils Relative: 4 % (ref 0.0–5.0)
HEMATOCRIT: 42.1 % (ref 39.0–52.0)
Hemoglobin: 14.2 g/dL (ref 13.0–17.0)
LYMPHS ABS: 1.7 10*3/uL (ref 0.7–4.0)
Lymphocytes Relative: 16.9 % (ref 12.0–46.0)
MCHC: 33.7 g/dL (ref 30.0–36.0)
MCV: 95.8 fl (ref 78.0–100.0)
MONO ABS: 0.8 10*3/uL (ref 0.1–1.0)
Monocytes Relative: 8.4 % (ref 3.0–12.0)
NEUTROS ABS: 7.1 10*3/uL (ref 1.4–7.7)
NEUTROS PCT: 70.3 % (ref 43.0–77.0)
Platelets: 201 10*3/uL (ref 150.0–400.0)
RBC: 4.4 Mil/uL (ref 4.22–5.81)
RDW: 12.5 % (ref 11.5–15.5)
WBC: 10.1 10*3/uL (ref 4.0–10.5)

## 2013-10-20 LAB — LIPID PANEL
CHOL/HDL RATIO: 4
Cholesterol: 188 mg/dL (ref 0–200)
HDL: 42.2 mg/dL (ref >39.00)
LDL CALC: 128 mg/dL — AB (ref 0–99)
NONHDL: 145.8
TRIGLYCERIDES: 87 mg/dL (ref 0.0–149.0)
VLDL: 17.4 mg/dL (ref 0.0–40.0)

## 2013-10-20 LAB — BASIC METABOLIC PANEL
BUN: 19 mg/dL (ref 6–23)
CHLORIDE: 101 meq/L (ref 96–112)
CO2: 29 meq/L (ref 19–32)
Calcium: 9.3 mg/dL (ref 8.4–10.5)
Creatinine, Ser: 1 mg/dL (ref 0.4–1.5)
GFR: 85.88 mL/min (ref >60.00)
GLUCOSE: 92 mg/dL (ref 70–99)
POTASSIUM: 4.1 meq/L (ref 3.5–5.1)
SODIUM: 138 meq/L (ref 135–145)

## 2013-10-20 LAB — TSH: TSH: 4.86 u[IU]/mL — AB (ref 0.35–4.50)

## 2013-10-20 LAB — HEMOGLOBIN A1C: Hgb A1c MFr Bld: 5.5 % (ref 4.6–6.5)

## 2013-10-20 NOTE — Progress Notes (Signed)
Order placed for f/u lab.   

## 2013-10-21 ENCOUNTER — Encounter: Payer: Self-pay | Admitting: *Deleted

## 2013-11-03 NOTE — Telephone Encounter (Signed)
Unread mychart message mailed to patient 

## 2013-11-16 ENCOUNTER — Other Ambulatory Visit (INDEPENDENT_AMBULATORY_CARE_PROVIDER_SITE_OTHER): Payer: BC Managed Care – PPO

## 2013-11-16 DIAGNOSIS — R7989 Other specified abnormal findings of blood chemistry: Secondary | ICD-10-CM

## 2013-11-16 DIAGNOSIS — E7800 Pure hypercholesterolemia, unspecified: Secondary | ICD-10-CM

## 2013-11-16 DIAGNOSIS — R946 Abnormal results of thyroid function studies: Secondary | ICD-10-CM

## 2013-11-16 LAB — TSH: TSH: 0.54 u[IU]/mL (ref 0.35–4.50)

## 2013-11-16 LAB — LIPID PANEL
CHOL/HDL RATIO: 4
Cholesterol: 179 mg/dL (ref 0–200)
HDL: 40.6 mg/dL (ref >39.00)
LDL Cholesterol: 120 mg/dL — ABNORMAL HIGH (ref 0–99)
NONHDL: 138.4
TRIGLYCERIDES: 94 mg/dL (ref 0.0–149.0)
VLDL: 18.8 mg/dL (ref 0.0–40.0)

## 2013-11-17 ENCOUNTER — Encounter: Payer: Self-pay | Admitting: Internal Medicine

## 2013-12-24 ENCOUNTER — Ambulatory Visit: Payer: Self-pay | Admitting: Oncology

## 2013-12-28 ENCOUNTER — Ambulatory Visit: Payer: Self-pay | Admitting: Oncology

## 2013-12-28 LAB — COMPREHENSIVE METABOLIC PANEL
AST: 19 U/L (ref 15–37)
Albumin: 3.9 g/dL (ref 3.4–5.0)
Alkaline Phosphatase: 80 U/L
Anion Gap: 6 — ABNORMAL LOW (ref 7–16)
BUN: 16 mg/dL (ref 7–18)
Bilirubin,Total: 0.4 mg/dL (ref 0.2–1.0)
CALCIUM: 8.8 mg/dL (ref 8.5–10.1)
CHLORIDE: 103 mmol/L (ref 98–107)
CREATININE: 1 mg/dL (ref 0.60–1.30)
Co2: 33 mmol/L — ABNORMAL HIGH (ref 21–32)
EGFR (Non-African Amer.): >60
Glucose: 103 mg/dL — ABNORMAL HIGH (ref 65–99)
Osmolality: 285 (ref 275–301)
Potassium: 4.1 mmol/L (ref 3.5–5.1)
SGPT (ALT): 38 U/L
Sodium: 142 mmol/L (ref 136–145)
TOTAL PROTEIN: 7.2 g/dL (ref 6.4–8.2)

## 2013-12-28 LAB — CBC CANCER CENTER
BASOS ABS: 0.1 x10 3/mm (ref 0.0–0.1)
Basophil %: 0.8 %
Eosinophil #: 0.4 x10 3/mm (ref 0.0–0.7)
Eosinophil %: 5.3 %
HCT: 42.2 % (ref 40.0–52.0)
HGB: 14.3 g/dL (ref 13.0–18.0)
LYMPHS ABS: 1.3 x10 3/mm (ref 1.0–3.6)
Lymphocyte %: 19.2 %
MCH: 32 pg (ref 26.0–34.0)
MCHC: 33.9 g/dL (ref 32.0–36.0)
MCV: 94 fL (ref 80–100)
MONO ABS: 0.5 x10 3/mm (ref 0.2–1.0)
Monocyte %: 7.2 %
NEUTROS ABS: 4.7 x10 3/mm (ref 1.4–6.5)
NEUTROS PCT: 67.5 %
PLATELETS: 202 x10 3/mm (ref 150–440)
RBC: 4.47 10*6/uL (ref 4.40–5.90)
RDW: 12.3 % (ref 11.5–14.5)
WBC: 6.9 x10 3/mm (ref 3.8–10.6)

## 2014-01-15 ENCOUNTER — Ambulatory Visit: Payer: Self-pay | Admitting: Oncology

## 2014-04-19 ENCOUNTER — Ambulatory Visit (INDEPENDENT_AMBULATORY_CARE_PROVIDER_SITE_OTHER): Payer: BLUE CROSS/BLUE SHIELD | Admitting: Internal Medicine

## 2014-04-19 ENCOUNTER — Encounter: Payer: Self-pay | Admitting: Internal Medicine

## 2014-04-19 VITALS — BP 130/80 | HR 55 | Temp 98.3°F | Ht 71.5 in | Wt 206.1 lb

## 2014-04-19 DIAGNOSIS — R739 Hyperglycemia, unspecified: Secondary | ICD-10-CM | POA: Diagnosis not present

## 2014-04-19 DIAGNOSIS — R9389 Abnormal findings on diagnostic imaging of other specified body structures: Secondary | ICD-10-CM

## 2014-04-19 DIAGNOSIS — N400 Enlarged prostate without lower urinary tract symptoms: Secondary | ICD-10-CM | POA: Diagnosis not present

## 2014-04-19 DIAGNOSIS — R030 Elevated blood-pressure reading, without diagnosis of hypertension: Secondary | ICD-10-CM

## 2014-04-19 DIAGNOSIS — R938 Abnormal findings on diagnostic imaging of other specified body structures: Secondary | ICD-10-CM | POA: Diagnosis not present

## 2014-04-19 DIAGNOSIS — IMO0001 Reserved for inherently not codable concepts without codable children: Secondary | ICD-10-CM

## 2014-04-19 NOTE — Progress Notes (Signed)
Pre visit review using our clinic review tool, if applicable. No additional management support is needed unless otherwise documented below in the visit note. 

## 2014-04-24 ENCOUNTER — Encounter: Payer: Self-pay | Admitting: Internal Medicine

## 2014-04-24 NOTE — Assessment & Plan Note (Signed)
Low carb diet and exercise.  Follow.  

## 2014-04-24 NOTE — Assessment & Plan Note (Signed)
Just saw Dr Yves Dill.  States ok.  Plans f/u in 6 months.

## 2014-04-24 NOTE — Progress Notes (Signed)
[name]., male   DOB: 1955/03/11, 59 y.o.   MRN: [id number]   Subjective:    [name]., male    DOB: 07-Apr-1955, 59 y.o.   MRN: [id number]  HPI  Patient here for a scheduled follow up.  States he is doing well.  Saw Dr Yves Dill last month.  States ok.  Planned f/u in 6 months.  Has been on first shift for one month.  Doing well with this.  Saw hematology.  CT chest ok.  No cardiac symptoms with increased activity or exertion.  Breathing stable.  Bowels stable.    Past Medical History  Diagnosis Date  . Glaucoma   . Allergy     Current Outpatient Prescriptions on File Prior to Visit  Medication Sig Dispense Refill  . dorzolamide-timolol (COSOPT) 22.3-6.8 MG/ML ophthalmic solution     . fexofenadine (ALLEGRA) 180 MG tablet Take 180 mg by mouth daily.    Marland Kitchen LATANOPROST OP Apply 1 drop to eye at bedtime. Both eyes    . NASONEX 50 MCG/ACT nasal spray Place 2 sprays into the nose at bedtime as needed.      No current facility-administered medications on file prior to visit.    Review of Systems  Constitutional: Negative for appetite change and unexpected weight change.  HENT: Negative for congestion and sinus pressure.   Respiratory: Negative for cough, chest tightness and shortness of breath.   Cardiovascular: Negative for chest pain, palpitations and leg swelling.  Gastrointestinal: Negative for nausea, abdominal pain and diarrhea.  Genitourinary: Negative for dysuria and difficulty urinating.  Neurological: Negative for dizziness, light-headedness and headaches.       Objective:     Blood pressure recheck: 134/84, pulse 56  Physical Exam  Constitutional: He appears well-developed and well-nourished. No distress.  HENT:  Nose: Nose normal.  Mouth/Throat: Oropharynx is clear and moist. No oropharyngeal exudate.  Neck: Neck supple.  Cardiovascular: Normal rate and regular rhythm.   Pulmonary/Chest: Effort normal and breath  sounds normal. No respiratory distress.  Abdominal: Soft. Bowel sounds are normal. There is no tenderness.  Musculoskeletal: He exhibits no edema.  Lymphadenopathy:    He has no cervical adenopathy.  Psychiatric: He has a normal mood and affect. His behavior is normal.    BP 130/80 mmHg  Pulse 55  Temp(Src) 98.3 F (36.8 C) (Oral)  Ht 5' 11.5" (1.816 m)  Wt 206 lb 2 oz (93.498 kg)  BMI 28.35 kg/m2  SpO2 97% Wt Readings from Last 3 Encounters:  04/19/14 206 lb 2 oz (93.498 kg)  10/16/13 204 lb 8 oz (92.761 kg)  05/07/13 202 lb 4 oz (91.74 kg)     Lab Results  Component Value Date   WBC 10.1 10/20/2013   HGB 14.2 10/20/2013   HCT 42.1 10/20/2013   PLT 201.0 10/20/2013   GLUCOSE 92 10/20/2013   CHOL 179 11/16/2013   TRIG 94.0 11/16/2013   HDL 40.60 11/16/2013   LDLCALC 120* 11/16/2013   ALT 39 10/20/2013   AST 28 10/20/2013   NA 138 10/20/2013   K 4.1 10/20/2013   CL 101 10/20/2013   CREATININE 1.0 10/20/2013   BUN 19 10/20/2013   CO2 29 10/20/2013   TSH 0.54 11/16/2013   HGBA1C 5.5 10/20/2013       Assessment & Plan:   Problem List Items Addressed This Visit    Abnormal CT scan, neck    Saw ENT.  Seeing Dr Oliva Bustard.  States ok.        BPH (benign prostatic hypertrophy) - Primary    Just saw Dr Yves Dill.  States ok.  Plans f/u in 6 months.        Elevated blood pressure    Blood pressure doing well as outlined.  Follow.       Hyperglycemia    Low carb diet and exercise.  Follow.            Einar Pheasant, MD

## 2014-04-24 NOTE — Assessment & Plan Note (Signed)
Saw ENT.  Seeing Dr Oliva Bustard.  States ok.

## 2014-04-24 NOTE — Assessment & Plan Note (Signed)
Blood pressure doing well as outlined.  Follow.  

## 2014-05-18 ENCOUNTER — Telehealth: Payer: Self-pay | Admitting: Internal Medicine

## 2014-05-18 NOTE — Telephone Encounter (Signed)
Pt wife dropped off screening results from [place]. Results in Dr. Bary Leriche box/msn

## 2014-05-19 NOTE — Telephone Encounter (Signed)
Results placed in green folder

## 2014-05-19 NOTE — Telephone Encounter (Signed)
Labs reviewed and appeared to be ok.

## 2014-05-20 ENCOUNTER — Encounter: Payer: Self-pay | Admitting: *Deleted

## 2014-05-20 NOTE — Telephone Encounter (Signed)
Sent mychart message

## 2014-06-03 NOTE — Telephone Encounter (Signed)
Unread mychart message mailed to patient 

## 2014-10-21 ENCOUNTER — Encounter: Payer: Self-pay | Admitting: Internal Medicine

## 2014-10-21 ENCOUNTER — Ambulatory Visit (INDEPENDENT_AMBULATORY_CARE_PROVIDER_SITE_OTHER): Payer: BLUE CROSS/BLUE SHIELD | Admitting: Internal Medicine

## 2014-10-21 VITALS — BP 136/80 | HR 55 | Temp 98.3°F | Resp 18 | Ht 71.5 in | Wt 206.0 lb

## 2014-10-21 DIAGNOSIS — Z0000 Encounter for general adult medical examination without abnormal findings: Secondary | ICD-10-CM | POA: Diagnosis not present

## 2014-10-21 DIAGNOSIS — Z91048 Other nonmedicinal substance allergy status: Secondary | ICD-10-CM | POA: Diagnosis not present

## 2014-10-21 DIAGNOSIS — Z13220 Encounter for screening for lipoid disorders: Secondary | ICD-10-CM | POA: Diagnosis not present

## 2014-10-21 DIAGNOSIS — R739 Hyperglycemia, unspecified: Secondary | ICD-10-CM

## 2014-10-21 DIAGNOSIS — N400 Enlarged prostate without lower urinary tract symptoms: Secondary | ICD-10-CM

## 2014-10-21 DIAGNOSIS — R9389 Abnormal findings on diagnostic imaging of other specified body structures: Secondary | ICD-10-CM

## 2014-10-21 DIAGNOSIS — R938 Abnormal findings on diagnostic imaging of other specified body structures: Secondary | ICD-10-CM | POA: Diagnosis not present

## 2014-10-21 DIAGNOSIS — Z23 Encounter for immunization: Secondary | ICD-10-CM

## 2014-10-21 DIAGNOSIS — Z9109 Other allergy status, other than to drugs and biological substances: Secondary | ICD-10-CM

## 2014-10-21 DIAGNOSIS — R21 Rash and other nonspecific skin eruption: Secondary | ICD-10-CM | POA: Diagnosis not present

## 2014-10-21 MED ORDER — CLOTRIMAZOLE-BETAMETHASONE 1-0.05 % EX CREA: 1.0000 "application " | TOPICAL_CREAM | Freq: Two times a day (BID) | CUTANEOUS | Status: DC

## 2014-10-21 NOTE — Progress Notes (Signed)
Patient ID: Jeremy Harvest., male   DOB: 1955/05/28, 59 y.o.   MRN: [id number]   Subjective:    Patient ID: Jeremy Harvest., male    DOB: 1955/05/16, 59 y.o.   MRN: [id number]  HPI  Patient with past history of allergies and hyperglycemia.  He comes in today to f/u on these issues.  Sees Dr Yves Dill for his prostate.  States was just evaluated two weeks ago.  States prostate ok.  Last psa 3.75.  Recommended f/u in 6 months.  Stays active.  No cardiac symptoms reported with increased activity or exertion.  No sob.  No abdominal pain or cramping.  Bowels doing well.  Went to [place].  Rash on his legs.  Circular lesions.  No itching or pain.     Past Medical History  Diagnosis Date  . Glaucoma   . Allergy    Past Surgical History  Procedure Laterality Date  . Back surgery  03/02/04  . Glaucoma surgery Right 2012   Family History  Problem Relation Age of Onset  . Hyperlipidemia Mother   . Prostate cancer Father   . Bone cancer Father    Social History   Social History  . Marital Status: Married    Spouse Name: N/A  . Number of Children: N/A  . Years of Education: N/A   Social History Main Topics  . Smoking status: Never Smoker   . Smokeless tobacco: Never Used  . Alcohol Use: No  . Drug Use: No  . Sexual Activity: Not Asked   Other Topics Concern  . None   Social History Narrative    Outpatient Encounter Prescriptions as of 10/21/2014  Medication Sig  . dorzolamide-timolol (COSOPT) 22.3-6.8 MG/ML ophthalmic solution   . fexofenadine (ALLEGRA) 180 MG tablet Take 180 mg by mouth daily.  Marland Kitchen LATANOPROST OP Apply 1 drop to eye at bedtime. Both eyes  . NASONEX 50 MCG/ACT nasal spray Place 2 sprays into the nose at bedtime as needed.   . clotrimazole-betamethasone (LOTRISONE) cream Apply 1 application topically 2 (two) times daily.   No facility-administered encounter medications on file as of 10/21/2014.    Review of Systems  Constitutional: Negative  for appetite change and unexpected weight change.  HENT: Negative for congestion and sinus pressure.   Eyes: Negative for pain and visual disturbance.  Respiratory: Negative for cough, chest tightness and shortness of breath.   Cardiovascular: Negative for chest pain, palpitations and leg swelling.  Gastrointestinal: Negative for nausea, vomiting, abdominal pain and diarrhea.  Genitourinary: Negative for dysuria and difficulty urinating.  Musculoskeletal: Negative for back pain and joint swelling.  Skin: Positive for rash. Negative for color change.  Neurological: Negative for dizziness, light-headedness and headaches.  Hematological: Negative for adenopathy. Does not bruise/bleed easily.  Psychiatric/Behavioral: Negative for dysphoric mood and agitation.       Objective:     Blood pressure rechecked by me:  134/80  /Physical Exam  Constitutional: He is oriented to person, place, and time. He appears well-developed and well-nourished. No distress.  HENT:  Head: Normocephalic and atraumatic.  Nose: Nose normal.  Mouth/Throat: Oropharynx is clear and moist. No oropharyngeal exudate.  Eyes: Conjunctivae are normal. Right eye exhibits no discharge. Left eye exhibits no discharge.  Neck: Neck supple. No thyromegaly present.  Cardiovascular: Normal rate and regular rhythm.   Pulmonary/Chest: Breath sounds normal. No respiratory distress. He has no wheezes.  Abdominal: Soft. Bowel sounds are normal. There is no tenderness.  Genitourinary: Rectum  normal.  Musculoskeletal: He exhibits no edema or tenderness.  Lymphadenopathy:    He has no cervical adenopathy.  Neurological: He is alert and oriented to person, place, and time.  Skin: Skin is warm and dry. No erythema.  Small circular lesion on lower left leg and right upper thigh.  Also has erythematous lesion - right lower leg.  Non tender.   Psychiatric: He has a normal mood and affect. His behavior is normal.    BP 136/80 mmHg   Pulse 55  Temp(Src) 98.3 F (36.8 C) (Oral)  Resp 18  Ht 5' 11.5" (1.816 m)  Wt 206 lb (93.441 kg)  BMI 28.33 kg/m2  SpO2 97% Wt Readings from Last 3 Encounters:  10/21/14 206 lb (93.441 kg)  04/19/14 206 lb 2 oz (93.498 kg)  10/16/13 204 lb 8 oz (92.761 kg)     Lab Results  Component Value Date   WBC 6.9 12/28/2013   HGB 14.3 12/28/2013   HCT 42.2 12/28/2013   PLT 202 12/28/2013   GLUCOSE 103* 12/28/2013   CHOL 179 11/16/2013   TRIG 94.0 11/16/2013   HDL 40.60 11/16/2013   LDLCALC 120* 11/16/2013   ALT 38 12/28/2013   AST 19 12/28/2013   NA 142 12/28/2013   K 4.1 12/28/2013   CL 103 12/28/2013   CREATININE 1.00 12/28/2013   BUN 16 12/28/2013   CO2 33* 12/28/2013   TSH 0.54 11/16/2013   INR 1.0 12/19/2012   HGBA1C 5.5 10/20/2013       Assessment & Plan:   Problem List Items Addressed This Visit    Abnormal CT scan, neck    Saw ENT.  Was evaluated by Dr Oliva Bustard.  States everything checked out fine.  He is not aware of f/u needed.  Will need to confirm.        BPH (benign prostatic hypertrophy)    Sees Dr Yves Dill.  States everything checked out fine.  Continues f/u with urology.        Environmental allergies    Takes allegra.  Symptoms controlled.       Health care maintenance    Physical today 10/21/14.  PSA and prostate followed by urology.  Colonoscopy 01/2012 - ok.        Hyperglycemia    Low carb diet and exercise.  Follow met b and a1c.        Relevant Orders   Comprehensive metabolic panel   TSH   CBC with Differential/Platelet   Rash    lotrisone cream as directed.  Follow.         Other Visit Diagnoses    Encounter for immunization    -  Primary    Screening cholesterol level        Relevant Orders    Lipid panel        Einar Pheasant, MD

## 2014-10-21 NOTE — Patient Instructions (Signed)

## 2014-10-21 NOTE — Progress Notes (Signed)
Pre-visit discussion using our clinic review tool. No additional management support is needed unless otherwise documented below in the visit note.  

## 2014-10-24 ENCOUNTER — Encounter: Payer: Self-pay | Admitting: Internal Medicine

## 2014-10-24 DIAGNOSIS — Z0000 Encounter for general adult medical examination without abnormal findings: Secondary | ICD-10-CM | POA: Insufficient documentation

## 2014-10-24 DIAGNOSIS — R21 Rash and other nonspecific skin eruption: Secondary | ICD-10-CM | POA: Insufficient documentation

## 2014-10-24 NOTE — Assessment & Plan Note (Signed)
Low carb diet and exercise.  Follow met b and a1c.   

## 2014-10-24 NOTE — Assessment & Plan Note (Signed)
lotrisone cream as directed.  Follow.  

## 2014-10-24 NOTE — Assessment & Plan Note (Signed)
Sees Dr Yves Dill.  States everything checked out fine.  Continues f/u with urology.

## 2014-10-24 NOTE — Assessment & Plan Note (Signed)
Physical today 10/21/14.  PSA and prostate followed by urology.  Colonoscopy 01/2012 - ok.

## 2014-10-24 NOTE — Assessment & Plan Note (Signed)
Takes allegra.  Symptoms controlled.

## 2014-10-24 NOTE — Assessment & Plan Note (Signed)
Saw ENT.  Was evaluated by Dr Oliva Bustard.  States everything checked out fine.  He is not aware of f/u needed.  Will need to confirm.

## 2014-11-04 ENCOUNTER — Other Ambulatory Visit (INDEPENDENT_AMBULATORY_CARE_PROVIDER_SITE_OTHER): Payer: BLUE CROSS/BLUE SHIELD

## 2014-11-04 DIAGNOSIS — Z13220 Encounter for screening for lipoid disorders: Secondary | ICD-10-CM

## 2014-11-04 DIAGNOSIS — R739 Hyperglycemia, unspecified: Secondary | ICD-10-CM

## 2014-11-04 LAB — CBC WITH DIFFERENTIAL/PLATELET
BASOS PCT: 0.6 % (ref 0.0–3.0)
Basophils Absolute: 0 10*3/uL (ref 0.0–0.1)
EOS PCT: 3.5 % (ref 0.0–5.0)
Eosinophils Absolute: 0.3 10*3/uL (ref 0.0–0.7)
HEMATOCRIT: 41.7 % (ref 39.0–52.0)
Hemoglobin: 13.9 g/dL (ref 13.0–17.0)
Lymphocytes Relative: 24 % (ref 12.0–46.0)
Lymphs Abs: 1.7 10*3/uL (ref 0.7–4.0)
MCHC: 33.3 g/dL (ref 30.0–36.0)
MCV: 96.5 fl (ref 78.0–100.0)
MONOS PCT: 7.3 % (ref 3.0–12.0)
Monocytes Absolute: 0.5 10*3/uL (ref 0.1–1.0)
NEUTROS ABS: 4.6 10*3/uL (ref 1.4–7.7)
Neutrophils Relative %: 64.6 % (ref 43.0–77.0)
PLATELETS: 229 10*3/uL (ref 150.0–400.0)
RBC: 4.32 Mil/uL (ref 4.22–5.81)
RDW: 12.8 % (ref 11.5–15.5)
WBC: 7.1 10*3/uL (ref 4.0–10.5)

## 2014-11-04 LAB — COMPREHENSIVE METABOLIC PANEL
ALBUMIN: 4.2 g/dL (ref 3.5–5.2)
ALK PHOS: 58 U/L (ref 39–117)
ALT: 20 U/L (ref 0–53)
AST: 20 U/L (ref 0–37)
BILIRUBIN TOTAL: 0.6 mg/dL (ref 0.2–1.2)
BUN: 20 mg/dL (ref 6–23)
CO2: 29 mEq/L (ref 19–32)
Calcium: 9.3 mg/dL (ref 8.4–10.5)
Chloride: 105 mEq/L (ref 96–112)
Creatinine, Ser: 0.93 mg/dL (ref 0.40–1.50)
GFR: 88.75 mL/min (ref >60.00)
Glucose, Bld: 108 mg/dL — ABNORMAL HIGH (ref 70–99)
POTASSIUM: 3.9 meq/L (ref 3.5–5.1)
Sodium: 141 mEq/L (ref 135–145)
TOTAL PROTEIN: 7.3 g/dL (ref 6.0–8.3)

## 2014-11-04 LAB — LIPID PANEL
Cholesterol: 165 mg/dL (ref 0–200)
HDL: 41.2 mg/dL (ref >39.00)
LDL Cholesterol: 106 mg/dL — ABNORMAL HIGH (ref 0–99)
NonHDL: 123.67
Total CHOL/HDL Ratio: 4
Triglycerides: 88 mg/dL (ref 0.0–149.0)
VLDL: 17.6 mg/dL (ref 0.0–40.0)

## 2014-11-04 LAB — TSH: TSH: 1.96 u[IU]/mL (ref 0.35–4.50)

## 2014-11-05 ENCOUNTER — Encounter: Payer: Self-pay | Admitting: *Deleted

## 2014-11-05 ENCOUNTER — Other Ambulatory Visit: Payer: Self-pay | Admitting: Internal Medicine

## 2014-11-05 DIAGNOSIS — R739 Hyperglycemia, unspecified: Secondary | ICD-10-CM

## 2014-11-05 NOTE — Progress Notes (Signed)
Order placed for f/u sugar.

## 2014-11-10 ENCOUNTER — Telehealth: Payer: Self-pay | Admitting: *Deleted

## 2014-11-10 NOTE — Telephone Encounter (Signed)
Patient was seen here last year after an abnormal CT and lymphadenopathy.  Patient states he does not have a follow up appointment at cancer center.  Dr. Nicki Reaper wants to know if he should have one.  Please call back to her nurse @ [phone].

## 2014-11-11 NOTE — Telephone Encounter (Signed)
Pt does not require follow up with [facility] at this time and only needs to continue regular follow up with PCP. If pt requests appt with [facility] then we can see patient in November for follow up.

## 2014-11-11 NOTE — Telephone Encounter (Signed)
Called Dr. Bary Leriche office and left message that patient should follow up with PCP and does not need to follow up with [place] unless he would like to.  If he does wish to see Dr. Oliva Bustard we can make appointment for sometime in November.

## 2014-12-07 ENCOUNTER — Other Ambulatory Visit: Payer: BLUE CROSS/BLUE SHIELD

## 2015-01-11 ENCOUNTER — Other Ambulatory Visit (INDEPENDENT_AMBULATORY_CARE_PROVIDER_SITE_OTHER): Payer: BLUE CROSS/BLUE SHIELD

## 2015-01-11 DIAGNOSIS — R739 Hyperglycemia, unspecified: Secondary | ICD-10-CM | POA: Diagnosis not present

## 2015-01-11 LAB — HEMOGLOBIN A1C: HEMOGLOBIN A1C: 5.6 % (ref 4.6–6.5)

## 2015-01-12 ENCOUNTER — Encounter: Payer: Self-pay | Admitting: Internal Medicine

## 2015-01-12 LAB — GLUCOSE, FASTING: GLUCOSE, FASTING: 106 mg/dL — AB (ref 65–99)

## 2015-01-18 ENCOUNTER — Ambulatory Visit
Admission: EM | Admit: 2015-01-18 | Discharge: 2015-01-18 | Disposition: A | Discharge status: Home / Self Care | Payer: BLUE CROSS/BLUE SHIELD | Attending: Family Medicine | Admitting: Family Medicine

## 2015-01-18 ENCOUNTER — Encounter: Payer: Self-pay | Admitting: Emergency Medicine

## 2015-01-18 DIAGNOSIS — J0100 Acute maxillary sinusitis, unspecified: Secondary | ICD-10-CM

## 2015-01-18 MED ORDER — BENZONATATE 100 MG PO CAPS: 100.0000 mg | ORAL_CAPSULE | Freq: Three times a day (TID) | ORAL | Status: DC | PRN

## 2015-01-18 MED ORDER — AMOXICILLIN-POT CLAVULANATE 875-125 MG PO TABS: 1.0000 | ORAL_TABLET | Freq: Two times a day (BID) | ORAL | Status: DC

## 2015-01-18 NOTE — ED Provider Notes (Signed)
[facility]  ____________________________________________  Time seen: Approximately 5:33 PM  I have reviewed the triage vital signs and the nursing notes.   HISTORY  Chief Complaint Facial Pain and Nasal Congestion   HPI Jeremy Fischer. is a 60 y.o. male  presents for complaints of 1 week of runny nose, nasal congestion, sinus drainage and sinus pressure. Patient reports that over the last 3 days the drainage and sinus pain has increased. States now getting very thick greenish yellowish nasal drainage. Patient states that current sinus discomfort is described as a pressure at 5 out of 10 and states that sinuses feel clogged. Patient reports that he can feel the drainage in the back of his throat. States intermittent cough. States cough is mostly related to the drainage in the back of throat.  Denies chest pain, shortness breath, abdominal pain, dizziness, weakness, known fevers. Reports multiple sick contacts at work recently. Reports continues to eat and drink well. reports has not yet taken any medications for these symptoms.  PCP: Dr. Nicki Reaper  Past Medical History  Diagnosis Date  . Glaucoma   . Allergy     Patient Active Problem List   Diagnosis Date Noted  . Health care maintenance 10/24/2014  . Rash 10/24/2014  . Elevated blood pressure 10/18/2013  . Abnormal CT scan, neck 05/10/2013  . Hyperglycemia 05/10/2013  . Vertigo 12/28/2012  . BPH (benign prostatic hypertrophy) 10/22/2012  . Glaucoma 10/15/2012  . Environmental allergies 10/15/2012    Past Surgical History  Procedure Laterality Date  . Back surgery  03/02/04  . Glaucoma surgery Right 2012    Current Outpatient Rx  Name  Route  Sig  Dispense  Refill  . clotrimazole-betamethasone (LOTRISONE) cream   Topical   Apply 1 application topically 2 (two) times daily.   30 g   0   . dorzolamide-timolol (COSOPT) 22.3-6.8 MG/ML ophthalmic solution               .           . LATANOPROST  OP   Ophthalmic   Apply 1 drop to eye at bedtime. Both eyes         . NASONEX 50 MCG/ACT nasal spray   Nasal   Place 2 sprays into the nose at bedtime as needed.            Allergies Contrast media  Family History  Problem Relation Age of Onset  . Hyperlipidemia Mother   . Prostate cancer Father   . Bone cancer Father     Social History Social History  Substance Use Topics  . Smoking status: Never Smoker   . Smokeless tobacco: Never Used  . Alcohol Use: No    Review of Systems Constitutional: No fever/chills Eyes: No visual changes. ENT: No sore throat. positive runny nose, nasal congestion, sinus pressure and sinus drainage. Positive intermittent cough. Cardiovascular: Denies chest pain. Respiratory: Denies shortness of breath. Gastrointestinal: No abdominal pain.  No nausea, no vomiting.  No diarrhea.  No constipation. Genitourinary: Negative for dysuria. Musculoskeletal: Negative for back pain. Skin: Negative for rash. Neurological: Negative for headaches, focal weakness or numbness.  10-point ROS otherwise negative.  ____________________________________________   PHYSICAL EXAM:  VITAL SIGNS: ED Triage Vitals  Enc Vitals Group     BP 01/18/15 1705 136/80 mmHg     Pulse Rate 01/18/15 1705 63     Resp 01/18/15 1705 16     Temp 01/18/15 1705 97 F (36.1 C)  Temp Source 01/18/15 1705 Tympanic     SpO2 01/18/15 1705 99 %     Weight 01/18/15 1705 203 lb (92.08 kg)     Height 01/18/15 1705 6' (1.829 m)     Head Cir --      Peak Flow --      Pain Score 01/18/15 1707 2     Pain Loc --      Pain Edu? --      Excl. in [place]? --     Constitutional: Alert and oriented. Well appearing and in no acute distress. Eyes: Conjunctivae are normal. PERRL. EOMI. Head: Atraumatic. mild to moderate tenderness to palpation bilateral frontal and maxillary sinus. No swelling. No erythema.  Ears: no erythema, normal TMs bilaterally.   Nose:  Nasal congestion with  bilateral nasal turbinate erythema and edema with greenish nasal drainage.  Mouth/Throat: Mucous membranes are moist.  Oropharynx non-erythematous. no tonsillar swelling or exudate. Neck: No stridor.  No cervical spine tenderness to palpation. Hematological/Lymphatic/Immunilogical: No cervical lymphadenopathy. Cardiovascular: Normal rate, regular rhythm. Grossly normal heart sounds.  Good peripheral circulation. Respiratory: Normal respiratory effort.  No retractions. Lungs CTAB. no wheezes, rales or rhonchi. Good air movement. Gastrointestinal: Soft and nontender.  Musculoskeletal: No lower or upper extremity tenderness nor edema.  Neurologic:  Normal speech and language. No gross focal neurologic deficits are appreciated. No gait instability. Skin:  Skin is warm, dry and intact. No rash noted. Psychiatric: Mood and affect are normal. Speech and behavior are normal.  ____________________________________________   LABS (all labs ordered are listed, but only abnormal results are displayed)  Labs Reviewed - No data to display  INITIAL IMPRESSION / ASSESSMENT AND PLAN / ED COURSE  Pertinent labs & imaging results that were available during my care of the patient were reviewed by me and considered in my medical decision making (see chart for details).  Very well-appearing patient. No acute distress. Presents for the complaints of 1 week of runny nose, nasal congestion, sinus pain sinus drainage with intermittent cough that has worsened over last 3 days. Denies any fevers. Reports multiple sick contacts. Lungs clear throughout. Abdomen soft and nontender. Will treat sinusitis with oral Augmentin and when necessary Tessalon Perles. Encouraged over-the-counter Claritin-D or Mucinex. Encouraged rest and fluids and PCP follow up.  Discussed follow up with Primary care physician this week. Discussed follow up and return parameters including no resolution or any worsening concerns. Patient verbalized  understanding and agreed to plan.    ____________________________________________   FINAL CLINICAL IMPRESSION(S) / ED DIAGNOSES  Final diagnoses:  Acute maxillary sinusitis, recurrence not specified       Marylene Land, NP 01/18/15 1826

## 2015-01-18 NOTE — ED Notes (Signed)
Patient c/o sinus pain and pressure since Saturday.  Patient denies fevers.

## 2015-01-18 NOTE — Discharge Instructions (Signed)
Take  medication as prescribed. Rest. Drink plenty of fluids.  Follow-up with your primary care physician this week as needed. Return to urgent care as needed for new or worsening concerns.   Sinusitis, Adult Sinusitis is redness, soreness, and inflammation of the paranasal sinuses. Paranasal sinuses are air pockets within the bones of your face. They are located beneath your eyes, in the middle of your forehead, and above your eyes. In healthy paranasal sinuses, mucus is able to drain out, and air is able to circulate through them by way of your nose. However, when your paranasal sinuses are inflamed, mucus and air can become trapped. This can allow bacteria and other germs to grow and cause infection. Sinusitis can develop quickly and last only a short time (acute) or continue over a long period (chronic). Sinusitis that lasts for more than 12 weeks is considered chronic. CAUSES Causes of sinusitis include:  Allergies.  Structural abnormalities, such as displacement of the cartilage that separates your nostrils (deviated septum), which can decrease the air flow through your nose and sinuses and affect sinus drainage.  Functional abnormalities, such as when the small hairs (cilia) that line your sinuses and help remove mucus do not work properly or are not present. SIGNS AND SYMPTOMS Symptoms of acute and chronic sinusitis are the same. The primary symptoms are pain and pressure around the affected sinuses. Other symptoms include:  Upper toothache.  Earache.  Headache.  Bad breath.  Decreased sense of smell and taste.  A cough, which worsens when you are lying flat.  Fatigue.  Fever.  Thick drainage from your nose, which often is green and may contain pus (purulent).  Swelling and warmth over the affected sinuses. DIAGNOSIS Your health care provider will perform a physical exam. During your exam, your health care provider may perform any of the following to help determine if  you have acute sinusitis or chronic sinusitis:  Look in your nose for signs of abnormal growths in your nostrils (nasal polyps).  Tap over the affected sinus to check for signs of infection.  View the inside of your sinuses using an imaging device that has a light attached (endoscope). If your health care provider suspects that you have chronic sinusitis, one or more of the following tests may be recommended:  Allergy tests.  Nasal culture. A sample of mucus is taken from your nose, sent to a lab, and screened for bacteria.  Nasal cytology. A sample of mucus is taken from your nose and examined by your health care provider to determine if your sinusitis is related to an allergy. TREATMENT Most cases of acute sinusitis are related to a viral infection and will resolve on their own within 10 days. Sometimes, medicines are prescribed to help relieve symptoms of both acute and chronic sinusitis. These may include pain medicines, decongestants, nasal steroid sprays, or saline sprays. However, for sinusitis related to a bacterial infection, your health care provider will prescribe antibiotic medicines. These are medicines that will help kill the bacteria causing the infection. Rarely, sinusitis is caused by a fungal infection. In these cases, your health care provider will prescribe antifungal medicine. For some cases of chronic sinusitis, surgery is needed. Generally, these are cases in which sinusitis recurs more than 3 times per year, despite other treatments. HOME CARE INSTRUCTIONS  Drink plenty of water. Water helps thin the mucus so your sinuses can drain more easily.  Use a humidifier.  Inhale steam 3-4 times a day (for example, sit in  the bathroom with the shower running).  Apply a warm, moist washcloth to your face 3-4 times a day, or as directed by your health care provider.  Use saline nasal sprays to help moisten and clean your sinuses.  Take medicines only as directed by your  health care provider.  If you were prescribed either an antibiotic or antifungal medicine, finish it all even if you start to feel better. SEEK IMMEDIATE MEDICAL CARE IF:  You have increasing pain or severe headaches.  You have nausea, vomiting, or drowsiness.  You have swelling around your face.  You have vision problems.  You have a stiff neck.  You have difficulty breathing.   This information is not intended to replace advice given to you by your health care provider. Make sure you discuss any questions you have with your health care provider.   Document Released: 01/01/2005 Document Revised: 01/22/2014 Document Reviewed: 01/16/2011 Elsevier Interactive Patient Education [employer].

## 2015-04-21 ENCOUNTER — Encounter: Payer: Self-pay | Admitting: Internal Medicine

## 2015-04-21 ENCOUNTER — Ambulatory Visit (INDEPENDENT_AMBULATORY_CARE_PROVIDER_SITE_OTHER): Payer: BLUE CROSS/BLUE SHIELD | Admitting: Internal Medicine

## 2015-04-21 ENCOUNTER — Telehealth: Payer: Self-pay | Admitting: Internal Medicine

## 2015-04-21 VITALS — BP 120/80 | HR 56 | Temp 98.4°F | Resp 18 | Ht 71.5 in | Wt 193.1 lb

## 2015-04-21 DIAGNOSIS — Z01810 Encounter for preprocedural cardiovascular examination: Secondary | ICD-10-CM

## 2015-04-21 DIAGNOSIS — C61 Malignant neoplasm of prostate: Secondary | ICD-10-CM | POA: Diagnosis not present

## 2015-04-21 DIAGNOSIS — R739 Hyperglycemia, unspecified: Secondary | ICD-10-CM | POA: Diagnosis not present

## 2015-04-21 DIAGNOSIS — Z01818 Encounter for other preprocedural examination: Secondary | ICD-10-CM

## 2015-04-21 NOTE — Telephone Encounter (Signed)
Opened in error

## 2015-04-21 NOTE — Progress Notes (Signed)
Patient ID: Jeremy Harvest., male   DOB: 1955-09-10, 60 y.o.   MRN: [id number]   Subjective:    Patient ID: Jeremy Harvest., male    DOB: 1955-09-25, 59 y.o.   MRN: [id number]  HPI  Patient here for a scheduled follow up.  Was just recently diagnosed with prostate cancer.  Is trying to decide what treatment route would be best.  Has seen urology.  Plans to f/u again next week with urology.  Stays active.  No chest pain or tightness.  No sob.  No acid reflux.  No abdominal pain or cramping.  Bowels stable.  No cardiac symptoms with increased activity or exertion.     Past Medical History  Diagnosis Date  . Glaucoma   . Allergy    Past Surgical History  Procedure Laterality Date  . Back surgery  03/02/04  . Glaucoma surgery Right 2012   Family History  Problem Relation Age of Onset  . Hyperlipidemia Mother   . Prostate cancer Father   . Bone cancer Father    Social History   Social History  . Marital Status: Married    Spouse Name: N/A  . Number of Children: N/A  . Years of Education: N/A   Social History Main Topics  . Smoking status: Never Smoker   . Smokeless tobacco: Never Used  . Alcohol Use: No  . Drug Use: No  . Sexual Activity: Not Asked   Other Topics Concern  . None   Social History Narrative    Outpatient Encounter Prescriptions as of 04/21/2015  Medication Sig  . clotrimazole-betamethasone (LOTRISONE) cream Apply 1 application topically 2 (two) times daily.  . dorzolamide-timolol (COSOPT) 22.3-6.8 MG/ML ophthalmic solution   . fexofenadine (ALLEGRA) 180 MG tablet Take 180 mg by mouth daily.  Marland Kitchen LATANOPROST OP Apply 1 drop to eye at bedtime. Both eyes  . NASONEX 50 MCG/ACT nasal spray Place 2 sprays into the nose at bedtime as needed.   . [DISCONTINUED] amoxicillin-clavulanate (AUGMENTIN) 875-125 MG tablet Take 1 tablet by mouth every 12 (twelve) hours.  . [DISCONTINUED] benzonatate (TESSALON PERLES) 100 MG capsule Take 1 capsule (100 mg  total) by mouth 3 (three) times daily as needed for cough.   No facility-administered encounter medications on file as of 04/21/2015.    Review of Systems  Constitutional: Negative for appetite change and unexpected weight change.  HENT: Negative for congestion and sinus pressure.   Respiratory: Negative for cough, chest tightness and shortness of breath.   Cardiovascular: Negative for chest pain, palpitations and leg swelling.  Gastrointestinal: Negative for nausea, vomiting, abdominal pain and diarrhea.  Genitourinary: Negative for dysuria and difficulty urinating.  Musculoskeletal: Negative for back pain and joint swelling.  Skin: Negative for color change and rash.  Neurological: Negative for dizziness, light-headedness and headaches.  Psychiatric/Behavioral: Negative for dysphoric mood and agitation.       Objective:    Physical Exam  Constitutional: He appears well-developed and well-nourished. No distress.  HENT:  Nose: Nose normal.  Mouth/Throat: Oropharynx is clear and moist.  Neck: Neck supple. No thyromegaly present.  Cardiovascular: Normal rate and regular rhythm.   Pulmonary/Chest: Effort normal and breath sounds normal. No respiratory distress.  Abdominal: Soft. Bowel sounds are normal. There is no tenderness.  Musculoskeletal: He exhibits no edema or tenderness.  Lymphadenopathy:    He has no cervical adenopathy.  Skin: No rash noted. No erythema.  Psychiatric: He has a normal mood and affect. His behavior is  normal.    BP 120/80 mmHg  Pulse 56  Temp(Src) 98.4 F (36.9 C) (Oral)  Resp 18  Ht 5' 11.5" (1.816 m)  Wt 193 lb 2 oz (87.601 kg)  BMI 26.56 kg/m2  SpO2 96% Wt Readings from Last 3 Encounters:  04/21/15 193 lb 2 oz (87.601 kg)  01/18/15 203 lb (92.08 kg)  10/21/14 206 lb (93.441 kg)     Lab Results  Component Value Date   WBC 7.1 11/04/2014   HGB 13.9 11/04/2014   HCT 41.7 11/04/2014   PLT 229.0 11/04/2014   GLUCOSE 108* 11/04/2014   CHOL  165 11/04/2014   TRIG 88.0 11/04/2014   HDL 41.20 11/04/2014   LDLCALC 106* 11/04/2014   ALT 20 11/04/2014   AST 20 11/04/2014   NA 141 11/04/2014   K 3.9 11/04/2014   CL 105 11/04/2014   CREATININE 0.93 11/04/2014   BUN 20 11/04/2014   CO2 29 11/04/2014   TSH 1.96 11/04/2014   INR 1.0 12/19/2012   HGBA1C 5.6 01/11/2015       Assessment & Plan:   Problem List Items Addressed This Visit    Hyperglycemia    Low carb diet and exercise.  Follow met b and a1c.        Pre-op evaluation    Discussed with patient and his wife today.  He denies any chest pain, tightness or sob.  EKG reveals SR with no acute ischemic changes.  I feel he is at low risk from a cardiac standpoint to proceed with planned procedure/surgery.  Will need close intra op and post op monitoring of his heart rate and blood pressure to avoid extremes.        Prostate cancer [facility])    Just diagnosed.  Seeing urology.  Had questions about procedures.  Discussed treatment options.  Plans to f/u with urology next week to discuss with them more.         Other Visit Diagnoses    Pre-operative cardiovascular examination    -  Primary    Relevant Orders    EKG 12-Lead (Completed)        Einar Pheasant, MD

## 2015-04-21 NOTE — Progress Notes (Signed)
Pre-visit discussion using our clinic review tool. No additional management support is needed unless otherwise documented below in the visit note.  

## 2015-04-24 ENCOUNTER — Encounter: Payer: Self-pay | Admitting: Internal Medicine

## 2015-04-24 DIAGNOSIS — Z8546 Personal history of malignant neoplasm of prostate: Secondary | ICD-10-CM | POA: Insufficient documentation

## 2015-04-24 DIAGNOSIS — Z01818 Encounter for other preprocedural examination: Secondary | ICD-10-CM | POA: Insufficient documentation

## 2015-04-24 NOTE — Assessment & Plan Note (Signed)
Discussed with patient and his wife today.  He denies any chest pain, tightness or sob.  EKG reveals SR with no acute ischemic changes.  I feel he is at low risk from a cardiac standpoint to proceed with planned procedure/surgery.  Will need close intra op and post op monitoring of his heart rate and blood pressure to avoid extremes.

## 2015-04-24 NOTE — Assessment & Plan Note (Signed)
Just diagnosed.  Seeing urology.  Had questions about procedures.  Discussed treatment options.  Plans to f/u with urology next week to discuss with them more.

## 2015-04-24 NOTE — Assessment & Plan Note (Signed)
Low carb diet and exercise.  Follow met b and a1c.   

## 2015-04-25 DIAGNOSIS — C61 Malignant neoplasm of prostate: Secondary | ICD-10-CM | POA: Diagnosis not present

## 2015-04-25 DIAGNOSIS — R972 Elevated prostate specific antigen [PSA]: Secondary | ICD-10-CM | POA: Diagnosis not present

## 2015-04-26 ENCOUNTER — Encounter: Payer: Self-pay | Admitting: Internal Medicine

## 2015-04-26 ENCOUNTER — Telehealth: Payer: Self-pay | Admitting: *Deleted

## 2015-04-26 NOTE — Telephone Encounter (Signed)
Faxed over office notes, EKG, and labs to the [facility] in [place] [phone] Per patients wife. Left message on home phone that this had been done.

## 2015-04-26 NOTE — Telephone Encounter (Signed)
The following message is a copy and paste, patients wife requested to have this message sent over to triage.   Good Morning Dr. Nicki Reaper  I am planning on having the High Intensity Focused Ultrasound for prostate cancer done at [facility] in [place], [place] probably on April 21st. Dr. Maryan Puls will do the procedure. Could your office have the EKG results, notes and blood work done faxed to [facility] [phone]  Thank you  Cristopher Estimable

## 2015-04-28 NOTE — Telephone Encounter (Signed)
Ok to send last office note, labs and EKG.   Tell them to keep [place] posted and let us know how he is doing.

## 2015-05-04 DIAGNOSIS — D400 Neoplasm of uncertain behavior of prostate: Secondary | ICD-10-CM | POA: Diagnosis not present

## 2015-05-04 DIAGNOSIS — C61 Malignant neoplasm of prostate: Secondary | ICD-10-CM | POA: Diagnosis not present

## 2015-05-13 DIAGNOSIS — C61 Malignant neoplasm of prostate: Secondary | ICD-10-CM | POA: Diagnosis not present

## 2015-05-23 DIAGNOSIS — R102 Pelvic and perineal pain: Secondary | ICD-10-CM | POA: Diagnosis not present

## 2015-05-23 DIAGNOSIS — C61 Malignant neoplasm of prostate: Secondary | ICD-10-CM | POA: Diagnosis not present

## 2015-05-23 DIAGNOSIS — R972 Elevated prostate specific antigen [PSA]: Secondary | ICD-10-CM | POA: Diagnosis not present

## 2015-05-25 DIAGNOSIS — L300 Nummular dermatitis: Secondary | ICD-10-CM | POA: Diagnosis not present

## 2015-05-25 DIAGNOSIS — D2261 Melanocytic nevi of right upper limb, including shoulder: Secondary | ICD-10-CM | POA: Diagnosis not present

## 2015-05-25 DIAGNOSIS — D485 Neoplasm of uncertain behavior of skin: Secondary | ICD-10-CM | POA: Diagnosis not present

## 2015-08-30 DIAGNOSIS — R972 Elevated prostate specific antigen [PSA]: Secondary | ICD-10-CM | POA: Diagnosis not present

## 2015-08-30 DIAGNOSIS — C61 Malignant neoplasm of prostate: Secondary | ICD-10-CM | POA: Diagnosis not present

## 2015-08-30 DIAGNOSIS — D400 Neoplasm of uncertain behavior of prostate: Secondary | ICD-10-CM | POA: Diagnosis not present

## 2015-10-11 DIAGNOSIS — H401133 Primary open-angle glaucoma, bilateral, severe stage: Secondary | ICD-10-CM | POA: Diagnosis not present

## 2015-11-30 DIAGNOSIS — C61 Malignant neoplasm of prostate: Secondary | ICD-10-CM | POA: Diagnosis not present

## 2015-11-30 DIAGNOSIS — D400 Neoplasm of uncertain behavior of prostate: Secondary | ICD-10-CM | POA: Diagnosis not present

## 2016-02-23 ENCOUNTER — Encounter: Payer: Self-pay | Admitting: Family Medicine

## 2016-02-23 ENCOUNTER — Ambulatory Visit (INDEPENDENT_AMBULATORY_CARE_PROVIDER_SITE_OTHER): Payer: BLUE CROSS/BLUE SHIELD | Admitting: Family Medicine

## 2016-02-23 VITALS — BP 148/90 | HR 68 | Temp 99.0°F | Wt 200.6 lb

## 2016-02-23 DIAGNOSIS — J111 Influenza due to unidentified influenza virus with other respiratory manifestations: Secondary | ICD-10-CM

## 2016-02-23 DIAGNOSIS — R059 Cough, unspecified: Secondary | ICD-10-CM

## 2016-02-23 DIAGNOSIS — R05 Cough: Secondary | ICD-10-CM | POA: Diagnosis not present

## 2016-02-23 LAB — BASIC METABOLIC PANEL
BUN: 13 mg/dL (ref 6–23)
CALCIUM: 9.3 mg/dL (ref 8.4–10.5)
CO2: 32 meq/L (ref 19–32)
CREATININE: 0.99 mg/dL (ref 0.40–1.50)
Chloride: 102 mEq/L (ref 96–112)
GFR: 82.2 mL/min (ref >60.00)
Glucose, Bld: 156 mg/dL — ABNORMAL HIGH (ref 70–99)
Potassium: 3.9 mEq/L (ref 3.5–5.1)
SODIUM: 138 meq/L (ref 135–145)

## 2016-02-23 LAB — POCT INFLUENZA A/B
INFLUENZA A, POC: POSITIVE — AB
INFLUENZA B, POC: POSITIVE — AB

## 2016-02-23 MED ORDER — OSELTAMIVIR PHOSPHATE 75 MG PO CAPS: 75.0000 mg | ORAL_CAPSULE | Freq: Two times a day (BID) | ORAL | 0 refills | Status: DC

## 2016-02-23 NOTE — Progress Notes (Signed)
  Tommi Rumps, MD Phone: [phone]  Vonita Moss Linkyn Hendler. is a 61 y.o. male who presents today for same-day visit.  Patient notes 2-3 days of symptoms. Notes rhinorrhea started yesterday and started blowing yellow mucus out of his nose this morning. Feels congested in his head. Some nasal congestion. Minimal cough productive of yellow mucus. No fevers. No trouble breathing. Some postnasal drip. Has not tried any medications for this. No body aches or chills.  PMH: nonsmoker.   ROS see history of present illness  Objective  Physical Exam Vitals:   02/23/16 0944 02/23/16 0954  BP: (!) 158/90 (!) 148/90  Pulse: 68   Temp: 99 F (37.2 C)     BP Readings from Last 3 Encounters:  02/23/16 (!) 148/90  04/21/15 120/80  01/18/15 136/80   Wt Readings from Last 3 Encounters:  02/23/16 200 lb 9.6 oz (91 kg)  04/21/15 193 lb 2 oz (87.6 kg)  01/18/15 203 lb (92.1 kg)    Physical Exam  Constitutional: No distress.  HENT:  Head: Normocephalic and atraumatic.  Mouth/Throat: Oropharynx is clear and moist. No oropharyngeal exudate.  Normal TMs bilaterally  Eyes: Conjunctivae are normal. Pupils are equal, round, and reactive to light.  Neck: Neck supple.  Cardiovascular: Normal rate, regular rhythm and normal heart sounds.   Pulmonary/Chest: Effort normal and breath sounds normal.  Musculoskeletal: He exhibits no edema.  Lymphadenopathy:    He has no cervical adenopathy.  Neurological: He is alert. Gait normal.  Skin: He is not diaphoretic.     Assessment/Plan: Please see individual problem list.  Influenza Patient with positive rapid flu test. We'll treat with Tamiflu. It has been sometime since his renal function has been checked thus we will check this today. He'll stay well hydrated. Given return precautions.   Orders Placed This Encounter  Procedures  . Basic Metabolic Panel (BMET)  . POCT Influenza A/B    Meds ordered this encounter  Medications  .  oseltamivir (TAMIFLU) 75 MG capsule    Sig: Take 1 capsule (75 mg total) by mouth 2 (two) times daily.    Dispense:  10 capsule    Refill:  0    Tommi Rumps, MD [place]

## 2016-02-23 NOTE — Patient Instructions (Signed)
Nice to see you. You have the flu. We will treat you with Tamiflu. We'll check your kidney function given that it has been sometime since this has been checked. If you develop cough productive of blood, fevers, trouble breathing, or any new or changing symptoms please seek medical attention and medially.

## 2016-02-23 NOTE — Assessment & Plan Note (Signed)
Patient with positive rapid flu test. We'll treat with Tamiflu. It has been sometime since his renal function has been checked thus we will check this today. He'll stay well hydrated. Given return precautions.

## 2016-02-23 NOTE — Progress Notes (Signed)
Pre visit review using our clinic review tool, if applicable. No additional management support is needed unless otherwise documented below in the visit note. 

## 2016-02-29 DIAGNOSIS — D400 Neoplasm of uncertain behavior of prostate: Secondary | ICD-10-CM | POA: Diagnosis not present

## 2016-02-29 DIAGNOSIS — C61 Malignant neoplasm of prostate: Secondary | ICD-10-CM | POA: Diagnosis not present

## 2016-02-29 DIAGNOSIS — R972 Elevated prostate specific antigen [PSA]: Secondary | ICD-10-CM | POA: Diagnosis not present

## 2016-04-03 DIAGNOSIS — H401133 Primary open-angle glaucoma, bilateral, severe stage: Secondary | ICD-10-CM | POA: Diagnosis not present

## 2016-04-10 DIAGNOSIS — H401133 Primary open-angle glaucoma, bilateral, severe stage: Secondary | ICD-10-CM | POA: Diagnosis not present

## 2016-05-23 DIAGNOSIS — L570 Actinic keratosis: Secondary | ICD-10-CM | POA: Diagnosis not present

## 2016-05-23 DIAGNOSIS — D225 Melanocytic nevi of trunk: Secondary | ICD-10-CM | POA: Diagnosis not present

## 2016-06-04 DIAGNOSIS — R3914 Feeling of incomplete bladder emptying: Secondary | ICD-10-CM | POA: Diagnosis not present

## 2016-06-04 DIAGNOSIS — R350 Frequency of micturition: Secondary | ICD-10-CM | POA: Diagnosis not present

## 2016-06-04 DIAGNOSIS — R351 Nocturia: Secondary | ICD-10-CM | POA: Diagnosis not present

## 2016-06-04 DIAGNOSIS — D400 Neoplasm of uncertain behavior of prostate: Secondary | ICD-10-CM | POA: Diagnosis not present

## 2016-06-04 DIAGNOSIS — C61 Malignant neoplasm of prostate: Secondary | ICD-10-CM | POA: Diagnosis not present

## 2016-10-09 DIAGNOSIS — H401133 Primary open-angle glaucoma, bilateral, severe stage: Secondary | ICD-10-CM | POA: Diagnosis not present

## 2016-10-24 ENCOUNTER — Telehealth: Payer: Self-pay | Admitting: Internal Medicine

## 2016-10-24 MED ORDER — TETANUS-DIPHTH-ACELL PERTUSSIS 5-2.5-18.5 LF-MCG/0.5 IM SUSP: 0.5000 mL | Freq: Once | INTRAMUSCULAR | 0 refills | Status: AC

## 2016-10-24 NOTE — Telephone Encounter (Signed)
rx signed and placed in box.   

## 2016-10-24 NOTE — Telephone Encounter (Signed)
I have reviewed [facility] and chart do not see any history of TD or TDAP I have printed and put in your folder for sig.

## 2016-10-24 NOTE — Telephone Encounter (Signed)
Pt spouse called and stated that pt needs a tdap done and would like to get it done at the pharmacy but they need a script with which one that he needs. Please advise, thank you!  Call pt @ [phone] - [place], [place] - [address]

## 2016-10-25 NOTE — Telephone Encounter (Signed)
Called patient informed that it is at reception for pick up.

## 2016-12-12 DIAGNOSIS — C61 Malignant neoplasm of prostate: Secondary | ICD-10-CM | POA: Diagnosis not present

## 2016-12-12 DIAGNOSIS — D400 Neoplasm of uncertain behavior of prostate: Secondary | ICD-10-CM | POA: Diagnosis not present

## 2017-01-09 DIAGNOSIS — R972 Elevated prostate specific antigen [PSA]: Secondary | ICD-10-CM | POA: Diagnosis not present

## 2017-01-09 DIAGNOSIS — C61 Malignant neoplasm of prostate: Secondary | ICD-10-CM | POA: Diagnosis not present

## 2017-01-22 ENCOUNTER — Encounter: Payer: Self-pay | Admitting: Internal Medicine

## 2017-01-22 ENCOUNTER — Ambulatory Visit: Payer: BLUE CROSS/BLUE SHIELD | Admitting: Internal Medicine

## 2017-01-22 DIAGNOSIS — R739 Hyperglycemia, unspecified: Secondary | ICD-10-CM | POA: Diagnosis not present

## 2017-01-22 DIAGNOSIS — R030 Elevated blood-pressure reading, without diagnosis of hypertension: Secondary | ICD-10-CM

## 2017-01-22 DIAGNOSIS — C61 Malignant neoplasm of prostate: Secondary | ICD-10-CM | POA: Diagnosis not present

## 2017-01-22 NOTE — Progress Notes (Signed)
Patient ID: Jeremy Harvest., male   DOB: Mar 03, 1955, 62 y.o.   MRN: [id number]   Subjective:    Patient ID: Jeremy Harvest., male    DOB: August 29, 1955, 62 y.o.   MRN: [id number]  HPI  Patient here for a scheduled follow up.  States he is doing relatively well.  Some increased stress recently.  Being followed by urology for his prostate.  Recent elevation in PSA.  Has f/u this week to discuss plan.  No chest pain.  No sob.  No acid reflux.  No abdominal pain.  Bowels moving.     Past Medical History:  Diagnosis Date  . Allergy   . Glaucoma    Past Surgical History:  Procedure Laterality Date  . BACK SURGERY  03/02/04  . GLAUCOMA SURGERY Right 2012   Family History  Problem Relation Age of Onset  . Hyperlipidemia Mother   . Prostate cancer Father   . Bone cancer Father    Social History   Socioeconomic History  . Marital status: Married    Spouse name: None  . Number of children: None  . Years of education: None  . Highest education level: None  Social Needs  . Financial resource strain: None  . Food insecurity - worry: None  . Food insecurity - inability: None  . Transportation needs - medical: None  . Transportation needs - non-medical: None  Occupational History  . None  Tobacco Use  . Smoking status: Never Smoker  . Smokeless tobacco: Never Used  Substance and Sexual Activity  . Alcohol use: No    Alcohol/week: 0.0 oz  . Drug use: No  . Sexual activity: None  Other Topics Concern  . None  Social History Narrative  . None    Outpatient Encounter Medications as of 01/22/2017  Medication Sig  . clotrimazole-betamethasone (LOTRISONE) cream Apply 1 application topically 2 (two) times daily.  . dorzolamide-timolol (COSOPT) 22.3-6.8 MG/ML ophthalmic solution   . fexofenadine (ALLEGRA) 180 MG tablet Take 180 mg by mouth daily.  Marland Kitchen LATANOPROST OP Apply 1 drop to eye at bedtime. Both eyes  . NASONEX 50 MCG/ACT nasal spray Place 2 sprays into the nose at  bedtime as needed.   Marland Kitchen oseltamivir (TAMIFLU) 75 MG capsule Take 1 capsule (75 mg total) by mouth 2 (two) times daily.   No facility-administered encounter medications on file as of 01/22/2017.     Review of Systems  Constitutional: Negative for appetite change and unexpected weight change.  HENT: Negative for congestion and sinus pressure.   Respiratory: Negative for cough, chest tightness and shortness of breath.   Cardiovascular: Negative for chest pain, palpitations and leg swelling.  Gastrointestinal: Negative for abdominal pain, diarrhea, nausea and vomiting.  Genitourinary: Negative for difficulty urinating and dysuria.  Musculoskeletal: Negative for back pain and joint swelling.  Skin: Negative for color change and rash.  Neurological: Negative for dizziness, light-headedness and headaches.  Psychiatric/Behavioral: Negative for agitation and dysphoric mood.       Objective:     Blood pressure rechecked by me:  136/138/78  Physical Exam  Constitutional: He appears well-developed and well-nourished. No distress.  HENT:  Mouth/Throat: Oropharynx is clear and moist.  Neck: Neck supple. No thyromegaly present.  Cardiovascular: Normal rate and regular rhythm.  Pulmonary/Chest: Effort normal and breath sounds normal. No respiratory distress.  Abdominal: Soft. Bowel sounds are normal. There is no tenderness.  Musculoskeletal: He exhibits no edema.  Lymphadenopathy:    He has no  cervical adenopathy.  Skin: No rash noted. No erythema.  Psychiatric: He has a normal mood and affect. His behavior is normal.    BP 136/78   Pulse (!) 56   Temp 98.5 F (36.9 C) (Oral)   Ht 5' 11.5" (1.816 m)   Wt 197 lb 3.2 oz (89.4 kg)   SpO2 98%   BMI 27.12 kg/m  Wt Readings from Last 3 Encounters:  01/22/17 197 lb 3.2 oz (89.4 kg)  02/23/16 200 lb 9.6 oz (91 kg)  04/21/15 193 lb 2 oz (87.6 kg)     Lab Results  Component Value Date   WBC 7.1 11/04/2014   HGB 13.9 11/04/2014   HCT  41.7 11/04/2014   PLT 229.0 11/04/2014   GLUCOSE 156 (H) 02/23/2016   CHOL 165 11/04/2014   TRIG 88.0 11/04/2014   HDL 41.20 11/04/2014   LDLCALC 106 (H) 11/04/2014   ALT 20 11/04/2014   AST 20 11/04/2014   NA 138 02/23/2016   K 3.9 02/23/2016   CL 102 02/23/2016   CREATININE 0.99 02/23/2016   BUN 13 02/23/2016   CO2 32 02/23/2016   TSH 1.96 11/04/2014   INR 1.0 12/19/2012   HGBA1C 5.6 01/11/2015       Assessment & Plan:   Problem List Items Addressed This Visit    Elevated blood pressure reading    Blood pressure on initial check elevated.  Recheck improved.  Follow pressures.        Hyperglycemia    Low carb diet and exercise.  Follow met b and a1c.        Prostate cancer [facility])    Seeing urology.  Recent elevation of psa.  Has f/u planned this week.            Einar Pheasant, MD

## 2017-01-22 NOTE — Progress Notes (Signed)
Pre visit review using our clinic review tool, if applicable. No additional management support is needed unless otherwise documented below in the visit note. 

## 2017-01-23 DIAGNOSIS — D400 Neoplasm of uncertain behavior of prostate: Secondary | ICD-10-CM | POA: Diagnosis not present

## 2017-01-23 DIAGNOSIS — R972 Elevated prostate specific antigen [PSA]: Secondary | ICD-10-CM | POA: Diagnosis not present

## 2017-01-23 DIAGNOSIS — C61 Malignant neoplasm of prostate: Secondary | ICD-10-CM | POA: Diagnosis not present

## 2017-01-26 ENCOUNTER — Encounter: Payer: Self-pay | Admitting: Internal Medicine

## 2017-01-26 NOTE — Assessment & Plan Note (Signed)
Seeing urology.  Recent elevation of psa.  Has f/u planned this week.

## 2017-01-26 NOTE — Assessment & Plan Note (Signed)
Blood pressure on initial check elevated.  Recheck improved.  Follow pressures.

## 2017-01-26 NOTE — Assessment & Plan Note (Signed)
Low carb diet and exercise.  Follow met b and a1c.   

## 2017-01-30 ENCOUNTER — Telehealth: Payer: Self-pay | Admitting: Internal Medicine

## 2017-01-30 NOTE — Telephone Encounter (Signed)
I am not sure who this is and why they needed office notes.

## 2017-01-30 NOTE — Telephone Encounter (Signed)
FYI Dr Nicki Reaper,  I have faxed last office notes to [facility].

## 2017-01-30 NOTE — Telephone Encounter (Signed)
[facility] needs pt Last office visit faxed to them

## 2017-02-01 NOTE — Telephone Encounter (Signed)
Spoke with Dr. Nicki Reaper about this patient the other day. Dr. Yves Dill referred him to [facility].

## 2017-02-11 DIAGNOSIS — C61 Malignant neoplasm of prostate: Secondary | ICD-10-CM | POA: Diagnosis not present

## 2017-02-11 DIAGNOSIS — R972 Elevated prostate specific antigen [PSA]: Secondary | ICD-10-CM | POA: Diagnosis not present

## 2017-02-23 ENCOUNTER — Other Ambulatory Visit: Payer: Self-pay

## 2017-02-23 ENCOUNTER — Emergency Department
Admission: EM | Admit: 2017-02-23 | Discharge: 2017-02-24 | Disposition: A | Discharge status: Home / Self Care | Payer: BLUE CROSS/BLUE SHIELD | Attending: Emergency Medicine | Admitting: Emergency Medicine

## 2017-02-23 ENCOUNTER — Emergency Department: Payer: BLUE CROSS/BLUE SHIELD

## 2017-02-23 ENCOUNTER — Encounter: Payer: Self-pay | Admitting: Emergency Medicine

## 2017-02-23 DIAGNOSIS — Z79899 Other long term (current) drug therapy: Secondary | ICD-10-CM | POA: Diagnosis not present

## 2017-02-23 DIAGNOSIS — R079 Chest pain, unspecified: Secondary | ICD-10-CM | POA: Insufficient documentation

## 2017-02-23 DIAGNOSIS — R0789 Other chest pain: Secondary | ICD-10-CM | POA: Diagnosis not present

## 2017-02-23 DIAGNOSIS — Z8546 Personal history of malignant neoplasm of prostate: Secondary | ICD-10-CM | POA: Insufficient documentation

## 2017-02-23 DIAGNOSIS — K2970 Gastritis, unspecified, without bleeding: Secondary | ICD-10-CM | POA: Diagnosis not present

## 2017-02-23 HISTORY — DX: Malignant neoplasm of prostate: C61

## 2017-02-23 LAB — CBC
HCT: 40 % (ref 40.0–52.0)
HEMOGLOBIN: 13.9 g/dL (ref 13.0–18.0)
MCH: 32.4 pg (ref 26.0–34.0)
MCHC: 34.9 g/dL (ref 32.0–36.0)
MCV: 92.9 fL (ref 80.0–100.0)
Platelets: 190 10*3/uL (ref 150–440)
RBC: 4.3 MIL/uL — AB (ref 4.40–5.90)
RDW: 12.5 % (ref 11.5–14.5)
WBC: 7.4 10*3/uL (ref 3.8–10.6)

## 2017-02-23 LAB — BASIC METABOLIC PANEL
Anion gap: 7 (ref 5–15)
BUN: 17 mg/dL (ref 6–20)
CHLORIDE: 100 mmol/L — AB (ref 101–111)
CO2: 30 mmol/L (ref 22–32)
Calcium: 9.1 mg/dL (ref 8.9–10.3)
Creatinine, Ser: 1.03 mg/dL (ref 0.61–1.24)
GFR calc non Af Amer: >60 mL/min (ref >60)
Glucose, Bld: 124 mg/dL — ABNORMAL HIGH (ref 65–99)
POTASSIUM: 4.1 mmol/L (ref 3.5–5.1)
SODIUM: 137 mmol/L (ref 135–145)

## 2017-02-23 LAB — TROPONIN I

## 2017-02-23 MED ORDER — ASPIRIN 81 MG PO CHEW
324.0000 mg | CHEWABLE_TABLET | Freq: Once | ORAL | Status: DC
  Filled 2017-02-23: qty 4

## 2017-02-23 MED ORDER — FAMOTIDINE 40 MG PO TABS: 40.0000 mg | ORAL_TABLET | Freq: Every evening | ORAL | 1 refills | Status: DC

## 2017-02-23 MED ORDER — SUCRALFATE 1 G PO TABS: 1.0000 g | ORAL_TABLET | Freq: Four times a day (QID) | ORAL | 0 refills | Status: DC

## 2017-02-23 MED ORDER — GI COCKTAIL ~~LOC~~
30.0000 mL | Freq: Once | ORAL | Status: AC
  Administered 2017-02-23: 30 mL via ORAL
  Filled 2017-02-23: qty 30

## 2017-02-23 NOTE — ED Provider Notes (Signed)
____________________________________________   I have reviewed the triage vital signs and the nursing notes.   HISTORY  Chief Complaint Chest Pain   History limited by: Not Limited   HPI Jeremy Fischer. is a 62 y.o. male who presents to the emergency department today because of concern for chest pain. States it started this evening after he ate a bite of food. It is located in the central chest. Denies any radiation to his jaw or arm. He did have some shortness of breath with the pain initially but that has improved. The patient denies any nausea or vomiting. Denies similar symptoms in the past. Did undergo prostate surgery a couple of weeks ago.   Per medical record review patient has a history of prostate cancer.      Past Medical History:  Diagnosis Date  . Allergy   . Glaucoma   . Prostate CA [facility])         Patient Active Problem List   Diagnosis Date Noted  . Prostate cancer ([facility] [place]) 04/24/2015  . Pre-op evaluation 04/24/2015  . Health care maintenance 10/24/2014  . Rash 10/24/2014  . Elevated blood pressure reading 10/18/2013  . Abnormal CT scan, neck 05/10/2013  . Hyperglycemia 05/10/2013  . BPH (benign prostatic hypertrophy) 10/22/2012  . Glaucoma 10/15/2012  . Environmental allergies 10/15/2012         Past Surgical History:  Procedure Laterality Date  . BACK SURGERY  03/02/04  . GLAUCOMA SURGERY Right 2012           Prior to Admission medications   Medication Sig Start Date End Date Taking? Authorizing Provider  clotrimazole-betamethasone (LOTRISONE) cream Apply 1 application topically 2 (two) times daily. 10/21/14   Einar Pheasant, MD  dorzolamide-timolol (COSOPT) 22.3-6.8 MG/ML ophthalmic solution  11/03/12   [provider]  fexofenadine (ALLEGRA) 180 MG tablet Take 180 mg by mouth daily.    [provider]  LATANOPROST OP Apply 1 drop to eye at bedtime. Both eyes    [provider]   NASONEX 50 MCG/ACT nasal spray Place 2 sprays into the nose at bedtime as needed.  11/02/12   [provider]  oseltamivir (TAMIFLU) 75 MG capsule Take 1 capsule (75 mg total) by mouth 2 (two) times daily. 02/23/16   Leone Haven, MD    Allergies Contrast media [iodinated diagnostic agents]       Family History  Problem Relation Age of Onset  . Hyperlipidemia Mother   . Prostate cancer Father   . Bone cancer Father     Social History Social History        Tobacco Use  . Smoking status: Never Smoker  . Smokeless tobacco: Never Used  Substance Use Topics  . Alcohol use: No    Alcohol/week: 0.0 oz  . Drug use: No    Review of Systems Constitutional: No fever/chills Eyes: No visual changes. ENT: No sore throat. Cardiovascular: Positive for chest pain. Respiratory: Positive for shortness of breath. Gastrointestinal: No abdominal pain.  No nausea, no vomiting.  No diarrhea.   Genitourinary: Negative for dysuria. Musculoskeletal: Negative for back pain. Skin: Negative for rash. Neurological: Negative for headaches, focal weakness or numbness.  ____________________________________________   PHYSICAL EXAM:  VITAL SIGNS:     ED Triage Vitals  Enc Vitals Group     BP --      Pulse --      Resp --      Temp --      Temp  src --      SpO2 --      Weight 02/23/17 1954 200 lb (90.7 kg)     Height 02/23/17 1954 5\' 10"  (1.778 m)     Head Circumference --      Peak Flow --      Pain Score 02/23/17 1953 5     Pain Loc --      Pain Edu? --      Excl. in [place]? --      Constitutional: Alert and oriented. Well appearing and in no distress. Eyes: Conjunctivae are normal.  ENT   Head: Normocephalic and atraumatic.   Nose: No congestion/rhinnorhea.   Mouth/Throat: Mucous membranes are moist.   Neck: No stridor. Hematological/Lymphatic/Immunilogical: No cervical lymphadenopathy. Cardiovascular: Normal rate,  regular rhythm.  No murmurs, rubs, or gallops. Respiratory: Normal respiratory effort without tachypnea nor retractions. Breath sounds are clear and equal bilaterally. No wheezes/rales/rhonchi. Gastrointestinal: Soft and non tender. No rebound. No guarding.  Genitourinary: Deferred Musculoskeletal: Normal range of motion in all extremities. No lower extremity edema. Neurologic:  Normal speech and language. No gross focal neurologic deficits are appreciated.  Skin:  Skin is warm, dry and intact. No rash noted. Psychiatric: Mood and affect are normal. Speech and behavior are normal. Patient exhibits appropriate insight and judgment.  ____________________________________________    LABS (pertinent positives/negatives)  Trop <0.03 CBC wbc 7.4, hgb 13.9, plt 190 BMP glu 124, k 4.1, na 137  ____________________________________________   EKG  CXR Nodule. No pneumonia.   ____________________________________________    RADIOLOGY  CXR No acute disease, positive for nodule  ____________________________________________   PROCEDURES  Procedures  ____________________________________________   INITIAL IMPRESSION / ASSESSMENT AND PLAN / ED COURSE  Pertinent labs & imaging results that were available during my care of the patient were reviewed by me and considered in my medical decision making (see chart for details).  Patient presented to the emergency department today because of concerns for chest pain that started shortly after eating.  Differential would be broad including cardiac etiology, pneumothorax, pneumonia, gastritis amongst other etiologies.  Patient had a broad workup including chest x-ray and blood work.  All of this was reassuring including 2 sets of troponin.  He did feel better after GI cocktail.  At this point I think likely gastritis.  Discussed findings with patient.  Additionally did discuss finding of nodule on chest x-ray with patient that he  will need to follow-up with primary care.   ____________________________________________   FINAL CLINICAL IMPRESSION(S) / ED DIAGNOSES  Gastritis Chest pain  Note: This dictation was prepared with Dragon dictation. Any transcriptional errors that result from this process are unintentional      Nance Pear, MD 02/24/17 1627

## 2017-02-23 NOTE — ED Triage Notes (Signed)
midsternal chest pain x 40 mins with no relief with nitro. 2 nitro sprays, 324 asa, hx prostate ca

## 2017-02-23 NOTE — Discharge Instructions (Signed)
Please seek medical attention for any high fevers, chest pain, shortness of breath, change in behavior, persistent vomiting, bloody stool or any other new or concerning symptoms.  

## 2017-02-25 DIAGNOSIS — C61 Malignant neoplasm of prostate: Secondary | ICD-10-CM | POA: Diagnosis not present

## 2017-02-25 DIAGNOSIS — R972 Elevated prostate specific antigen [PSA]: Secondary | ICD-10-CM | POA: Diagnosis not present

## 2017-02-26 ENCOUNTER — Telehealth: Payer: Self-pay | Admitting: Internal Medicine

## 2017-02-26 DIAGNOSIS — R918 Other nonspecific abnormal finding of lung field: Secondary | ICD-10-CM

## 2017-02-26 NOTE — Telephone Encounter (Signed)
-----   Message from Nanci Pina, LPN sent at 1/76/1607 12:49 PM EST ----- Regarding: RE: Nodule on x-ray  Patient is agreeable to CT scan for nodule, ----- Message ----- From: Einar Pheasant, MD Sent: 02/25/2017   8:31 AM To: Nanci Pina, LPN Subject: FW: Nodule on x-ray                            Received this staff message from [place].  Per note, pt aware.  Please call and make sure pt aware of incidental finding on chest xray of lung nodule.  Notify him, would like to schedule CT chest to further evaluate.  If agreeable, let me know and I will place the order for the scan.  Thanks    Dr Nicki Reaper ----- Message ----- From: Nance Pear, MD Sent: 02/24/2017   4:21 PM To: Einar Pheasant, MD Subject: Nodule on x-ray                                Dr. Nicki Reaper, I saw Mr. Figueira in the emergency department yesterday for chest pain. Work up was reassuring and I think it was likely gastritis/esophagitis that was causing the pain (felt better after GI cocktail). His chest x-ray did show a nodule. I just wanted to let you know so you could help him follow up for this. I did discuss it with patient and family and told them he would likely be getting a CT scan in the near future. Thank you. Nance Pear

## 2017-02-26 NOTE — Telephone Encounter (Signed)
Order placed for CT scan of chest.

## 2017-02-27 ENCOUNTER — Ambulatory Visit
Admission: RE | Admit: 2017-02-27 | Discharge: 2017-02-27 | Disposition: A | Discharge status: Home / Self Care | Payer: BLUE CROSS/BLUE SHIELD | Source: Ambulatory Visit | Attending: Internal Medicine | Admitting: Internal Medicine

## 2017-02-27 ENCOUNTER — Encounter: Payer: Self-pay | Admitting: Internal Medicine

## 2017-02-27 DIAGNOSIS — R911 Solitary pulmonary nodule: Secondary | ICD-10-CM | POA: Diagnosis not present

## 2017-02-27 DIAGNOSIS — R918 Other nonspecific abnormal finding of lung field: Secondary | ICD-10-CM | POA: Diagnosis not present

## 2017-03-05 DIAGNOSIS — N401 Enlarged prostate with lower urinary tract symptoms: Secondary | ICD-10-CM | POA: Diagnosis not present

## 2017-03-06 ENCOUNTER — Ambulatory Visit: Payer: BLUE CROSS/BLUE SHIELD

## 2017-03-06 DIAGNOSIS — R310 Gross hematuria: Secondary | ICD-10-CM | POA: Diagnosis not present

## 2017-03-06 DIAGNOSIS — D400 Neoplasm of uncertain behavior of prostate: Secondary | ICD-10-CM | POA: Diagnosis not present

## 2017-03-06 DIAGNOSIS — C61 Malignant neoplasm of prostate: Secondary | ICD-10-CM | POA: Diagnosis not present

## 2017-04-08 DIAGNOSIS — H401133 Primary open-angle glaucoma, bilateral, severe stage: Secondary | ICD-10-CM | POA: Diagnosis not present

## 2017-04-11 ENCOUNTER — Inpatient Hospital Stay: Payer: BLUE CROSS/BLUE SHIELD | Admitting: Internal Medicine

## 2017-04-15 DIAGNOSIS — H401133 Primary open-angle glaucoma, bilateral, severe stage: Secondary | ICD-10-CM | POA: Diagnosis not present

## 2017-04-16 DIAGNOSIS — C61 Malignant neoplasm of prostate: Secondary | ICD-10-CM | POA: Diagnosis not present

## 2017-04-16 DIAGNOSIS — D400 Neoplasm of uncertain behavior of prostate: Secondary | ICD-10-CM | POA: Diagnosis not present

## 2017-04-24 DIAGNOSIS — N5201 Erectile dysfunction due to arterial insufficiency: Secondary | ICD-10-CM | POA: Diagnosis not present

## 2017-04-24 DIAGNOSIS — D400 Neoplasm of uncertain behavior of prostate: Secondary | ICD-10-CM | POA: Diagnosis not present

## 2017-04-24 DIAGNOSIS — C61 Malignant neoplasm of prostate: Secondary | ICD-10-CM | POA: Diagnosis not present

## 2017-05-01 ENCOUNTER — Encounter: Payer: Self-pay | Admitting: Internal Medicine

## 2017-05-01 ENCOUNTER — Ambulatory Visit (INDEPENDENT_AMBULATORY_CARE_PROVIDER_SITE_OTHER): Payer: BLUE CROSS/BLUE SHIELD | Admitting: Internal Medicine

## 2017-05-01 VITALS — BP 122/70 | HR 68 | Temp 98.5°F | Ht 71.5 in | Wt 198.0 lb

## 2017-05-01 DIAGNOSIS — J019 Acute sinusitis, unspecified: Secondary | ICD-10-CM | POA: Diagnosis not present

## 2017-05-01 MED ORDER — AMOXICILLIN 500 MG PO TABS: 1000.0000 mg | ORAL_TABLET | Freq: Two times a day (BID) | ORAL | 0 refills | Status: AC

## 2017-05-01 MED ORDER — FLUTICASONE PROPIONATE 50 MCG/ACT NA SUSP: 2.0000 | Freq: Every day | NASAL | 12 refills | Status: DC

## 2017-05-01 NOTE — Progress Notes (Signed)
Subjective:    Patient ID: Jeremy Harvest., male    DOB: [birth year], 62 y.o.   MRN: [id number]  HPI Here due to respiratory infection  Has had a cough and drainage in his throat Post nasal drip with yellow sputum with cough Started 2-3 days ago Not allergies Frontal head pain No ear pain No sore throat No SOB No fever No chills or sweats  Hasn't taken anything Usually gets secondary sinus infections during allergy season  Current Outpatient Medications on File Prior to Visit  Medication Sig Dispense Refill  . dorzolamide-timolol (COSOPT) 22.3-6.8 MG/ML ophthalmic solution     . fexofenadine (ALLEGRA) 180 MG tablet Take 180 mg by mouth daily.    Marland Kitchen LATANOPROST OP Apply 1 drop to eye at bedtime. Both eyes     No current facility-administered medications on file prior to visit.     Allergies  Allergen Reactions  . Contrast Media [Iodinated Diagnostic Agents] Rash    Past Medical History:  Diagnosis Date  . Allergy   . Glaucoma   . Prostate CA [facility])     Past Surgical History:  Procedure Laterality Date  . BACK SURGERY  03/02/04  . GLAUCOMA SURGERY Right 2012    Family History  Problem Relation Age of Onset  . Hyperlipidemia Mother   . Prostate cancer Father   . Bone cancer Father     Social History   Socioeconomic History  . Marital status: Married    Spouse name: Not on file  . Number of children: Not on file  . Years of education: Not on file  . Highest education level: Not on file  Occupational History  . Not on file  Social Needs  . Financial resource strain: Not on file  . Food insecurity:    Worry: Not on file    Inability: Not on file  . Transportation needs:    Medical: Not on file    Non-medical: Not on file  Tobacco Use  . Smoking status: Never Smoker  . Smokeless tobacco: Never Used  Substance and Sexual Activity  . Alcohol use: No    Alcohol/week: 0.0 oz  . Drug use: No  . Sexual activity: Not on file  Lifestyle  .  Physical activity:    Days per week: Not on file    Minutes per session: Not on file  . Stress: Not on file  Relationships  . Social connections:    Talks on phone: Not on file    Gets together: Not on file    Attends religious service: Not on file    Active member of club or organization: Not on file    Attends meetings of clubs or organizations: Not on file    Relationship status: Not on file  . Intimate partner violence:    Fear of current or ex partner: Not on file    Emotionally abused: Not on file    Physically abused: Not on file    Forced sexual activity: Not on file  Other Topics Concern  . Not on file  Social History Narrative  . Not on file   Review of Systems No rash No vomiting or diarrhea Appetite is okay     Objective:   Physical Exam  Constitutional: He appears well-developed. No distress.  HENT:  Mouth/Throat: Oropharynx is clear and moist. No oropharyngeal exudate.  No sinus tenderness TMs normal Moderate nasal inflammation   Neck: No thyromegaly present.  Pulmonary/Chest: Effort normal and breath  sounds normal. No respiratory distress. He has no wheezes. He has no rales.  Lymphadenopathy:    He has no cervical adenopathy.          Assessment & Plan:

## 2017-05-01 NOTE — Patient Instructions (Signed)
Start the fluticasone nasal spray right away---and add a second antihistamine if needed. If you are worsening over the next few days, start the amoxicillin antibiotic.

## 2017-05-01 NOTE — Assessment & Plan Note (Signed)
Discussed likely still viral Will start flonase now If worsens, amoxil May want to add second antihistamine prn

## 2017-05-29 DIAGNOSIS — L821 Other seborrheic keratosis: Secondary | ICD-10-CM | POA: Diagnosis not present

## 2017-05-29 DIAGNOSIS — X32XXXA Exposure to sunlight, initial encounter: Secondary | ICD-10-CM | POA: Diagnosis not present

## 2017-05-29 DIAGNOSIS — L570 Actinic keratosis: Secondary | ICD-10-CM | POA: Diagnosis not present

## 2017-07-25 ENCOUNTER — Encounter: Payer: Self-pay | Admitting: Internal Medicine

## 2017-07-25 ENCOUNTER — Ambulatory Visit (INDEPENDENT_AMBULATORY_CARE_PROVIDER_SITE_OTHER): Payer: BLUE CROSS/BLUE SHIELD | Admitting: Internal Medicine

## 2017-07-25 DIAGNOSIS — R739 Hyperglycemia, unspecified: Secondary | ICD-10-CM | POA: Diagnosis not present

## 2017-07-25 DIAGNOSIS — C61 Malignant neoplasm of prostate: Secondary | ICD-10-CM | POA: Diagnosis not present

## 2017-07-25 DIAGNOSIS — Z9109 Other allergy status, other than to drugs and biological substances: Secondary | ICD-10-CM

## 2017-07-25 NOTE — Progress Notes (Signed)
Patient ID: Jeremy Harvest., male   DOB: Oct 13, 1955, 61 y.o.   MRN: [id number]   Subjective:    Patient ID: Jeremy Harvest., male    DOB: [birth year], 62 y.o.   MRN: [id number]  HPI  Patient here for a scheduled follow up.  He reports he is doing relatively well.  Seeing urology for elevated psa.  Has f/u next week.  Some increased stress related to this, but overall feels he is handling things relatively well.  Tries to stay active.  No chest pain.  No sob.  No acid reflux. No abdominal pain.  Bowels moving.     Past Medical History:  Diagnosis Date  . Allergy   . Glaucoma   . Prostate CA [facility])    Past Surgical History:  Procedure Laterality Date  . BACK SURGERY  03/02/04  . GLAUCOMA SURGERY Right 2012   Family History  Problem Relation Age of Onset  . Hyperlipidemia Mother   . Prostate cancer Father   . Bone cancer Father    Social History   Socioeconomic History  . Marital status: Married    Spouse name: Not on file  . Number of children: Not on file  . Years of education: Not on file  . Highest education level: Not on file  Occupational History  . Not on file  Social Needs  . Financial resource strain: Not on file  . Food insecurity:    Worry: Not on file    Inability: Not on file  . Transportation needs:    Medical: Not on file    Non-medical: Not on file  Tobacco Use  . Smoking status: Never Smoker  . Smokeless tobacco: Never Used  Substance and Sexual Activity  . Alcohol use: No    Alcohol/week: 0.0 oz  . Drug use: No  . Sexual activity: Not on file  Lifestyle  . Physical activity:    Days per week: Not on file    Minutes per session: Not on file  . Stress: Not on file  Relationships  . Social connections:    Talks on phone: Not on file    Gets together: Not on file    Attends religious service: Not on file    Active member of club or organization: Not on file    Attends meetings of clubs or organizations: Not on file   Relationship status: Not on file  Other Topics Concern  . Not on file  Social History Narrative  . Not on file    Outpatient Encounter Medications as of 07/25/2017  Medication Sig  . dorzolamide-timolol (COSOPT) 22.3-6.8 MG/ML ophthalmic solution   . fexofenadine (ALLEGRA) 180 MG tablet Take 180 mg by mouth daily.  . fluticasone (FLONASE) 50 MCG/ACT nasal spray Place 2 sprays into both nostrils daily. In each nostril  . LATANOPROST OP Apply 1 drop to eye at bedtime. Both eyes   No facility-administered encounter medications on file as of 07/25/2017.     Review of Systems  Constitutional: Negative for appetite change and unexpected weight change.  HENT: Negative for congestion and sinus pressure.   Respiratory: Negative for cough, chest tightness and shortness of breath.   Cardiovascular: Negative for chest pain, palpitations and leg swelling.  Gastrointestinal: Negative for abdominal pain, diarrhea, nausea and vomiting.  Genitourinary: Negative for difficulty urinating and dysuria.  Musculoskeletal: Negative for joint swelling and myalgias.  Skin: Negative for color change and rash.  Neurological: Negative for dizziness, light-headedness and headaches.  Psychiatric/Behavioral: Negative for agitation and dysphoric mood.       Objective:    Physical Exam  Constitutional: He appears well-developed and well-nourished. No distress.  HENT:  Nose: Nose normal.  Mouth/Throat: Oropharynx is clear and moist.  Neck: Neck supple. No thyromegaly present.  Cardiovascular: Normal rate and regular rhythm.  Pulmonary/Chest: Effort normal and breath sounds normal. No respiratory distress.  Abdominal: Soft. Bowel sounds are normal. There is no tenderness.  Musculoskeletal: He exhibits no edema or tenderness.  Lymphadenopathy:    He has no cervical adenopathy.  Skin: No rash noted. No erythema.  Psychiatric: He has a normal mood and affect. His behavior is normal.    BP 124/80 (BP  Location: Left Arm, Patient Position: Sitting, Cuff Size: Normal)   Pulse (!) 56   Wt 196 lb 6.4 oz (89.1 kg)   SpO2 97%   BMI 27.01 kg/m  Wt Readings from Last 3 Encounters:  07/25/17 196 lb 6.4 oz (89.1 kg)  05/01/17 198 lb (89.8 kg)  02/23/17 200 lb (90.7 kg)     Lab Results  Component Value Date   WBC 7.4 02/23/2017   HGB 13.9 02/23/2017   HCT 40.0 02/23/2017   PLT 190 02/23/2017   GLUCOSE 124 (H) 02/23/2017   CHOL 165 11/04/2014   TRIG 88.0 11/04/2014   HDL 41.20 11/04/2014   LDLCALC 106 (H) 11/04/2014   ALT 20 11/04/2014   AST 20 11/04/2014   NA 137 02/23/2017   K 4.1 02/23/2017   CL 100 (L) 02/23/2017   CREATININE 1.03 02/23/2017   BUN 17 02/23/2017   CO2 30 02/23/2017   TSH 1.96 11/04/2014   INR 1.0 12/19/2012   HGBA1C 5.6 01/11/2015    Ct Chest Wo Contrast  Result Date: 02/27/2017 CLINICAL DATA:  Possible lung nodule seen on recent chest x-ray. EXAM: CT CHEST WITHOUT CONTRAST TECHNIQUE: Multidetector CT imaging of the chest was performed following the standard protocol without IV contrast. COMPARISON:  Chest x-ray dated February 23, 2017. FINDINGS: Cardiovascular: No significant vascular findings. Normal heart size. No pericardial effusion. Normal caliber thoracic aorta. Mediastinum/Nodes: No enlarged mediastinal or axillary lymph nodes. Thyroid gland, trachea, and esophagus demonstrate no significant findings. Lungs/Pleura: Lungs are clear. No focal consolidation, pleural effusion, or pneumothorax. There are a few scattered sub 2 mm pulmonary nodules, benign. No suspicious pulmonary nodule. Upper Abdomen: No acute abnormality. Musculoskeletal: No acute or suspicious osseous finding. Degenerative changes of the thoracic spine. IMPRESSION: 1. No CT correlate for the nodular density seen on chest x-ray. No suspicious pulmonary nodule. Electronically Signed   By: Titus Dubin M.D.   On: 02/27/2017 13:40       Assessment & Plan:   Problem List Items Addressed This  Visit    Environmental allergies    Controlled.        Hyperglycemia    Low carb diet and exercise.  Follow met b and a1c.        Prostate cancer [facility])    Seeing urology.  Has f/u planned next week.            Einar Pheasant, MD

## 2017-07-29 DIAGNOSIS — C61 Malignant neoplasm of prostate: Secondary | ICD-10-CM | POA: Diagnosis not present

## 2017-07-29 DIAGNOSIS — N5201 Erectile dysfunction due to arterial insufficiency: Secondary | ICD-10-CM | POA: Diagnosis not present

## 2017-07-29 DIAGNOSIS — D400 Neoplasm of uncertain behavior of prostate: Secondary | ICD-10-CM | POA: Diagnosis not present

## 2017-07-29 DIAGNOSIS — Z125 Encounter for screening for malignant neoplasm of prostate: Secondary | ICD-10-CM | POA: Diagnosis not present

## 2017-07-30 ENCOUNTER — Encounter: Payer: Self-pay | Admitting: Internal Medicine

## 2017-07-30 NOTE — Assessment & Plan Note (Signed)
Seeing urology.  Has f/u planned next week.

## 2017-07-30 NOTE — Assessment & Plan Note (Signed)
Controlled.  

## 2017-07-30 NOTE — Assessment & Plan Note (Signed)
Low carb diet and exercise.  Follow met b and a1c.   

## 2017-07-31 ENCOUNTER — Encounter: Payer: Self-pay | Admitting: Internal Medicine

## 2017-08-02 ENCOUNTER — Other Ambulatory Visit: Payer: Self-pay | Admitting: Internal Medicine

## 2017-08-02 ENCOUNTER — Telehealth: Payer: Self-pay | Admitting: Radiology

## 2017-08-02 DIAGNOSIS — R739 Hyperglycemia, unspecified: Secondary | ICD-10-CM

## 2017-08-02 DIAGNOSIS — Z13220 Encounter for screening for lipoid disorders: Secondary | ICD-10-CM

## 2017-08-02 NOTE — Telephone Encounter (Signed)
Pt coming in for labs Monday, please place future orders. Thank you 

## 2017-08-02 NOTE — Telephone Encounter (Signed)
Orders placed for labs

## 2017-08-02 NOTE — Progress Notes (Signed)
Orders placed for f/u labs.  

## 2017-08-05 ENCOUNTER — Other Ambulatory Visit (INDEPENDENT_AMBULATORY_CARE_PROVIDER_SITE_OTHER): Payer: BLUE CROSS/BLUE SHIELD

## 2017-08-05 DIAGNOSIS — Z13220 Encounter for screening for lipoid disorders: Secondary | ICD-10-CM

## 2017-08-05 DIAGNOSIS — R739 Hyperglycemia, unspecified: Secondary | ICD-10-CM

## 2017-08-05 LAB — BASIC METABOLIC PANEL
BUN: 18 mg/dL (ref 6–23)
CHLORIDE: 102 meq/L (ref 96–112)
CO2: 29 meq/L (ref 19–32)
Calcium: 9.1 mg/dL (ref 8.4–10.5)
Creatinine, Ser: 0.96 mg/dL (ref 0.40–1.50)
GFR: 84.76 mL/min (ref >60.00)
Glucose, Bld: 108 mg/dL — ABNORMAL HIGH (ref 70–99)
Potassium: 4.5 mEq/L (ref 3.5–5.1)
SODIUM: 137 meq/L (ref 135–145)

## 2017-08-05 LAB — POCT GLYCOSYLATED HEMOGLOBIN (HGB A1C): HEMOGLOBIN A1C: 5.5 % (ref 4.0–5.6)

## 2017-08-05 LAB — LIPID PANEL
CHOLESTEROL: 166 mg/dL (ref 0–200)
HDL: 43.8 mg/dL (ref >39.00)
LDL Cholesterol: 105 mg/dL — ABNORMAL HIGH (ref 0–99)
NonHDL: 122.49
TRIGLYCERIDES: 88 mg/dL (ref 0.0–149.0)
Total CHOL/HDL Ratio: 4
VLDL: 17.6 mg/dL (ref 0.0–40.0)

## 2017-08-05 LAB — TSH: TSH: 2.94 u[IU]/mL (ref 0.35–4.50)

## 2017-08-05 LAB — HEPATIC FUNCTION PANEL
ALT: 21 U/L (ref 0–53)
AST: 18 U/L (ref 0–37)
Albumin: 4.2 g/dL (ref 3.5–5.2)
Alkaline Phosphatase: 63 U/L (ref 39–117)
Bilirubin, Direct: 0.1 mg/dL (ref 0.0–0.3)
TOTAL PROTEIN: 7.4 g/dL (ref 6.0–8.3)
Total Bilirubin: 0.5 mg/dL (ref 0.2–1.2)

## 2017-08-05 NOTE — Addendum Note (Signed)
Addended by: Arby Barrette on: 08/05/2017 10:49 AM   Modules accepted: Orders

## 2017-08-28 ENCOUNTER — Encounter: Payer: Self-pay | Admitting: *Deleted

## 2017-09-09 DIAGNOSIS — H1013 Acute atopic conjunctivitis, bilateral: Secondary | ICD-10-CM | POA: Diagnosis not present

## 2017-09-24 DIAGNOSIS — H401133 Primary open-angle glaucoma, bilateral, severe stage: Secondary | ICD-10-CM | POA: Diagnosis not present

## 2017-10-15 DIAGNOSIS — H401133 Primary open-angle glaucoma, bilateral, severe stage: Secondary | ICD-10-CM | POA: Diagnosis not present

## 2017-10-17 ENCOUNTER — Encounter: Payer: Self-pay | Admitting: *Deleted

## 2017-10-22 DIAGNOSIS — Z23 Encounter for immunization: Secondary | ICD-10-CM | POA: Diagnosis not present

## 2017-11-04 DIAGNOSIS — C61 Malignant neoplasm of prostate: Secondary | ICD-10-CM | POA: Diagnosis not present

## 2017-11-04 DIAGNOSIS — D400 Neoplasm of uncertain behavior of prostate: Secondary | ICD-10-CM | POA: Diagnosis not present

## 2017-11-04 DIAGNOSIS — N5201 Erectile dysfunction due to arterial insufficiency: Secondary | ICD-10-CM | POA: Diagnosis not present

## 2017-11-29 ENCOUNTER — Ambulatory Visit (INDEPENDENT_AMBULATORY_CARE_PROVIDER_SITE_OTHER): Payer: BLUE CROSS/BLUE SHIELD | Admitting: Internal Medicine

## 2017-11-29 DIAGNOSIS — Z9109 Other allergy status, other than to drugs and biological substances: Secondary | ICD-10-CM | POA: Diagnosis not present

## 2017-11-29 DIAGNOSIS — Z8546 Personal history of malignant neoplasm of prostate: Secondary | ICD-10-CM | POA: Diagnosis not present

## 2017-11-29 DIAGNOSIS — R739 Hyperglycemia, unspecified: Secondary | ICD-10-CM | POA: Diagnosis not present

## 2017-11-29 MED ORDER — ROSUVASTATIN CALCIUM 10 MG PO TABS
10.0000 mg | ORAL_TABLET | Freq: Every day | ORAL | 2 refills | Status: DC
Start: 2017-11-29 — End: 2017-12-31

## 2017-11-29 NOTE — Progress Notes (Signed)
Patient ID: Jeremy Harvest., male   DOB: January 22, 1955, 62 y.o.   MRN: [id number]   Subjective:    Patient ID: Jeremy Harvest., male    DOB: October 30, 1955, 62 y.o.   MRN: [id number]  HPI  Patient here for a scheduled follow up.  States he is doing well.  Had some nasal congestion and sore throat recently.  Better now.  No significant sinus pressure or congestion now.  No chest congestion.  No sob.  No acid reflux.  No abdominal pain.  Bowels moving.  No urine change.  Sees Dr Rogers Blocker q 4 months.  Last seen 3 weeks ago.  States everything checked out ok.  Overall he feels he is doing well.     Past Medical History:  Diagnosis Date  . Allergy   . Glaucoma   . Prostate CA [facility])    Past Surgical History:  Procedure Laterality Date  . BACK SURGERY  03/02/04  . GLAUCOMA SURGERY Right 2012   Family History  Problem Relation Age of Onset  . Hyperlipidemia Mother   . Prostate cancer Father   . Bone cancer Father    Social History   Socioeconomic History  . Marital status: Married    Spouse name: Not on file  . Number of children: Not on file  . Years of education: Not on file  . Highest education level: Not on file  Occupational History  . Not on file  Social Needs  . Financial resource strain: Not on file  . Food insecurity:    Worry: Not on file    Inability: Not on file  . Transportation needs:    Medical: Not on file    Non-medical: Not on file  Tobacco Use  . Smoking status: Never Smoker  . Smokeless tobacco: Never Used  Substance and Sexual Activity  . Alcohol use: No    Alcohol/week: 0.0 standard drinks  . Drug use: No  . Sexual activity: Not on file  Lifestyle  . Physical activity:    Days per week: Not on file    Minutes per session: Not on file  . Stress: Not on file  Relationships  . Social connections:    Talks on phone: Not on file    Gets together: Not on file    Attends religious service: Not on file    Active member of club or organization:  Not on file    Attends meetings of clubs or organizations: Not on file    Relationship status: Not on file  Other Topics Concern  . Not on file  Social History Narrative  . Not on file    Outpatient Encounter Medications as of 11/29/2017  Medication Sig  . dorzolamide-timolol (COSOPT) 22.3-6.8 MG/ML ophthalmic solution   . fexofenadine (ALLEGRA) 180 MG tablet Take 180 mg by mouth daily.  . fluticasone (FLONASE) 50 MCG/ACT nasal spray Place 2 sprays into both nostrils daily. In each nostril  . LATANOPROST OP Apply 1 drop to eye at bedtime. Both eyes  . rosuvastatin (CRESTOR) 10 MG tablet Take 1 tablet (10 mg total) by mouth daily.   No facility-administered encounter medications on file as of 11/29/2017.     Review of Systems  Constitutional: Negative for appetite change and unexpected weight change.  HENT: Negative for congestion and sinus pressure.   Respiratory: Negative for cough, chest tightness and shortness of breath.   Cardiovascular: Negative for chest pain, palpitations and leg swelling.  Gastrointestinal: Negative for abdominal  pain, diarrhea, nausea and vomiting.  Genitourinary: Negative for difficulty urinating and dysuria.  Musculoskeletal: Negative for joint swelling and myalgias.  Skin: Negative for color change and rash.  Neurological: Negative for dizziness, light-headedness and headaches.  Psychiatric/Behavioral: Negative for agitation and dysphoric mood.       Objective:    Physical Exam  Constitutional: He appears well-developed and well-nourished. No distress.  HENT:  Nose: Nose normal.  Mouth/Throat: Oropharynx is clear and moist.  Neck: Neck supple.  Cardiovascular: Normal rate and regular rhythm.  Pulmonary/Chest: Effort normal and breath sounds normal. No respiratory distress.  Abdominal: Soft. Bowel sounds are normal. There is no tenderness.  Musculoskeletal: He exhibits no edema or tenderness.  Lymphadenopathy:    He has no cervical  adenopathy.  Skin: No rash noted. No erythema.  Psychiatric: He has a normal mood and affect. His behavior is normal.    BP 130/78 (BP Location: Left Arm, Patient Position: Sitting, Cuff Size: Normal)   Pulse (!) 55   Temp 97.9 F (36.6 C) (Oral)   Resp 18   Wt 202 lb (91.6 kg)   SpO2 98%   BMI 27.78 kg/m  Wt Readings from Last 3 Encounters:  11/29/17 202 lb (91.6 kg)  07/25/17 196 lb 6.4 oz (89.1 kg)  05/01/17 198 lb (89.8 kg)     Lab Results  Component Value Date   WBC 7.4 02/23/2017   HGB 13.9 02/23/2017   HCT 40.0 02/23/2017   PLT 190 02/23/2017   GLUCOSE 108 (H) 08/05/2017   CHOL 166 08/05/2017   TRIG 88.0 08/05/2017   HDL 43.80 08/05/2017   LDLCALC 105 (H) 08/05/2017   ALT 21 08/05/2017   AST 18 08/05/2017   NA 137 08/05/2017   K 4.5 08/05/2017   CL 102 08/05/2017   CREATININE 0.96 08/05/2017   BUN 18 08/05/2017   CO2 29 08/05/2017   TSH 2.94 08/05/2017   INR 1.0 12/19/2012   HGBA1C 5.5 08/05/2017    Ct Chest Wo Contrast  Result Date: 02/27/2017 CLINICAL DATA:  Possible lung nodule seen on recent chest x-ray. EXAM: CT CHEST WITHOUT CONTRAST TECHNIQUE: Multidetector CT imaging of the chest was performed following the standard protocol without IV contrast. COMPARISON:  Chest x-ray dated February 23, 2017. FINDINGS: Cardiovascular: No significant vascular findings. Normal heart size. No pericardial effusion. Normal caliber thoracic aorta. Mediastinum/Nodes: No enlarged mediastinal or axillary lymph nodes. Thyroid gland, trachea, and esophagus demonstrate no significant findings. Lungs/Pleura: Lungs are clear. No focal consolidation, pleural effusion, or pneumothorax. There are a few scattered sub 2 mm pulmonary nodules, benign. No suspicious pulmonary nodule. Upper Abdomen: No acute abnormality. Musculoskeletal: No acute or suspicious osseous finding. Degenerative changes of the thoracic spine. IMPRESSION: 1. No CT correlate for the nodular density seen on chest  x-ray. No suspicious pulmonary nodule. Electronically Signed   By: Titus Dubin M.D.   On: 02/27/2017 13:40       Assessment & Plan:   Problem List Items Addressed This Visit    Environmental allergies    Recent nasal congestion and sore throat.  Better.  Follow.        History of prostate cancer    Followed by urology q 4 months.  Last evaluated 3 weeks ago. Stable - per pt.        Hyperglycemia    Low carb diet and exercise.  Follow met b and a1c.        Relevant Orders   Hemoglobin A1c  Hepatic function panel   Lipid panel   Basic metabolic panel       Einar Pheasant, MD

## 2017-12-08 ENCOUNTER — Encounter: Payer: Self-pay | Admitting: Internal Medicine

## 2017-12-08 NOTE — Assessment & Plan Note (Signed)
Low carb diet and exercise.  Follow met b and a1c.   

## 2017-12-08 NOTE — Assessment & Plan Note (Signed)
Followed by urology q 4 months.  Last evaluated 3 weeks ago. Stable - per pt.

## 2017-12-08 NOTE — Assessment & Plan Note (Signed)
Recent nasal congestion and sore throat.  Better.  Follow.

## 2017-12-31 ENCOUNTER — Other Ambulatory Visit: Payer: Self-pay | Admitting: Internal Medicine

## 2017-12-31 MED ORDER — ROSUVASTATIN CALCIUM 10 MG PO TABS: 10.0000 mg | ORAL_TABLET | Freq: Every day | ORAL | 1 refills | Status: DC

## 2017-12-31 NOTE — Progress Notes (Signed)
rx sent in for crestor #90 with one refill [facility].

## 2018-01-02 ENCOUNTER — Encounter: Payer: Self-pay | Admitting: Internal Medicine

## 2018-01-02 ENCOUNTER — Ambulatory Visit (INDEPENDENT_AMBULATORY_CARE_PROVIDER_SITE_OTHER): Payer: BLUE CROSS/BLUE SHIELD | Admitting: Internal Medicine

## 2018-01-02 VITALS — BP 124/80 | HR 80 | Temp 98.0°F | Ht 71.5 in | Wt 204.2 lb

## 2018-01-02 DIAGNOSIS — J069 Acute upper respiratory infection, unspecified: Secondary | ICD-10-CM

## 2018-01-02 DIAGNOSIS — J329 Chronic sinusitis, unspecified: Secondary | ICD-10-CM

## 2018-01-02 DIAGNOSIS — R05 Cough: Secondary | ICD-10-CM

## 2018-01-02 DIAGNOSIS — I10 Essential (primary) hypertension: Secondary | ICD-10-CM

## 2018-01-02 DIAGNOSIS — R059 Cough, unspecified: Secondary | ICD-10-CM

## 2018-01-02 MED ORDER — AMOXICILLIN-POT CLAVULANATE 875-125 MG PO TABS
1.0000 | ORAL_TABLET | Freq: Two times a day (BID) | ORAL | 0 refills | Status: DC
Start: 2018-01-02 — End: 2019-01-02

## 2018-01-02 NOTE — Patient Instructions (Signed)
Robitussin DM or Mucinex DM green label for cough  Tea with honey and lemon  Cough drops  lysol  Wash your hands     Upper Respiratory Infection, Adult An upper respiratory infection (URI) is a common viral infection of the nose, throat, and upper air passages that lead to the lungs. The most common type of URI is the common cold. URIs usually get better on their own, without medical treatment. What are the causes? A URI is caused by a virus. You may catch a virus by:  Breathing in droplets from an infected person's cough or sneeze.  Touching something that has been exposed to the virus (contaminated) and then touching your mouth, nose, or eyes. What increases the risk? You are more likely to get a URI if:  You are very young or very old.  It is autumn or winter.  You have close contact with others, such as at a daycare, school, or health care facility.  You smoke.  You have long-term (chronic) heart or lung disease.  You have a weakened disease-fighting (immune) system.  You have nasal allergies or asthma.  You are experiencing a lot of stress.  You work in an area that has poor air circulation.  You have poor nutrition. What are the signs or symptoms? A URI usually involves some of the following symptoms:  Runny or stuffy (congested) nose.  Sneezing.  Cough.  Sore throat.  Headache.  Fatigue.  Fever.  Loss of appetite.  Pain in your forehead, behind your eyes, and over your cheekbones (sinus pain).  Muscle aches.  Redness or irritation of the eyes.  Pressure in the ears or face. How is this diagnosed? This condition may be diagnosed based on your medical history and symptoms, and a physical exam. Your health care provider may use a cotton swab to take a mucus sample from your nose (nasal swab). This sample can be tested to determine what virus is causing the illness. How is this treated? URIs usually get better on their own within 7-10 days. You  can take steps at home to relieve your symptoms. Medicines cannot cure URIs, but your health care provider may recommend certain medicines to help relieve symptoms, such as:  Over-the-counter cold medicines.  Cough suppressants. Coughing is a type of defense against infection that helps to clear the respiratory system, so take these medicines only as recommended by your health care provider.  Fever-reducing medicines. Follow these instructions at home: Activity  Rest as needed.  If you have a fever, stay home from work or school until your fever is gone or until your health care provider says you are no longer contagious. Your health care provider may have you wear a face mask to prevent your infection from spreading. Relieving symptoms  Gargle with a salt-water mixture 3-4 times a day or as needed. To make a salt-water mixture, completely dissolve -1 tsp of salt in 1 cup of warm water.  Use a cool-mist humidifier to add moisture to the air. This can help you breathe more easily. Eating and drinking   Drink enough fluid to keep your urine pale yellow.  Eat soups and other clear broths. General instructions   Take over-the-counter and prescription medicines only as told by your health care provider. These include cold medicines, fever reducers, and cough suppressants.  Do not use any products that contain nicotine or tobacco, such as cigarettes and e-cigarettes. If you need help quitting, ask your health care provider.  Stay  away from secondhand smoke.  Stay up to date on all immunizations, including the yearly (annual) flu vaccine.  Keep all follow-up visits as told by your health care provider. This is important. How to prevent the spread of infection to others   URIs can be passed from person to person (are contagious). To prevent the infection from spreading: ? Wash your hands often with soap and water. If soap and water are not available, use hand sanitizer. ? Avoid  touching your mouth, face, eyes, or nose. ? Cough or sneeze into a tissue or your sleeve or elbow instead of into your hand or into the air. Contact a health care provider if:  You are getting worse instead of better.  You have a fever or chills.  Your mucus is brown or red.  You have yellow or brown discharge coming from your nose.  You have pain in your face, especially when you bend forward.  You have swollen neck glands.  You have pain while swallowing.  You have white areas in the back of your throat. Get help right away if:  You have shortness of breath that gets worse.  You have severe or persistent: ? Headache. ? Ear pain. ? Sinus pain. ? Chest pain.  You have chronic lung disease along with any of the following: ? Wheezing. ? Prolonged cough. ? Coughing up blood. ? A change in your usual mucus.  You have a stiff neck.  You have changes in your: ? Vision. ? Hearing. ? Thinking. ? Mood. Summary  An upper respiratory infection (URI) is a common infection of the nose, throat, and upper air passages that lead to the lungs.  A URI is caused by a virus.  URIs usually get better on their own within 7-10 days.  Medicines cannot cure URIs, but your health care provider may recommend certain medicines to help relieve symptoms. This information is not intended to replace advice given to you by your health care provider. Make sure you discuss any questions you have with your health care provider. Document Released: 06/27/2000 Document Revised: 08/17/2016 Document Reviewed: 08/17/2016 Elsevier Interactive Patient Education  2019 [employer].

## 2018-01-02 NOTE — Progress Notes (Signed)
Pre visit review using our clinic review tool, if applicable. No additional management support is needed unless otherwise documented below in the visit note. 

## 2018-01-02 NOTE — Progress Notes (Signed)
Chief Complaint  Patient presents with  . Sinusitis  . Cough  . URI   C/o cough with yellow/green phlegm sinus drainage x 3 days. No fever. Co workers have been sick. Nothing tried. He has been wheezing at night with chest congestion.     Review of Systems  Constitutional: Negative for fever.  HENT: Negative for sinus pain.        +PND    Eyes: Negative for blurred vision.  Respiratory: Positive for cough and wheezing. Negative for shortness of breath.   Cardiovascular: Negative for chest pain.  Musculoskeletal: Negative for myalgias.   Past Medical History:  Diagnosis Date  . Allergy   . Glaucoma   . Hyperlipidemia   . Prostate CA [facility])    Past Surgical History:  Procedure Laterality Date  . BACK SURGERY  03/02/04  . GLAUCOMA SURGERY Right 2012   Family History  Problem Relation Age of Onset  . Hyperlipidemia Mother   . Prostate cancer Father   . Bone cancer Father    Social History   Socioeconomic History  . Marital status: Married    Spouse name: Not on file  . Number of children: Not on file  . Years of education: Not on file  . Highest education level: Not on file  Occupational History  . Not on file  Social Needs  . Financial resource strain: Not on file  . Food insecurity:    Worry: Not on file    Inability: Not on file  . Transportation needs:    Medical: Not on file    Non-medical: Not on file  Tobacco Use  . Smoking status: Never Smoker  . Smokeless tobacco: Never Used  Substance and Sexual Activity  . Alcohol use: No    Alcohol/week: 0.0 standard drinks  . Drug use: No  . Sexual activity: Not on file  Lifestyle  . Physical activity:    Days per week: Not on file    Minutes per session: Not on file  . Stress: Not on file  Relationships  . Social connections:    Talks on phone: Not on file    Gets together: Not on file    Attends religious service: Not on file    Active member of club or organization: Not on file    Attends meetings  of clubs or organizations: Not on file    Relationship status: Not on file  . Intimate partner violence:    Fear of current or ex partner: Not on file    Emotionally abused: Not on file    Physically abused: Not on file    Forced sexual activity: Not on file  Other Topics Concern  . Not on file  Social History Narrative   Married with kids   Current Meds  Medication Sig  . dorzolamide-timolol (COSOPT) 22.3-6.8 MG/ML ophthalmic solution   . fexofenadine (ALLEGRA) 180 MG tablet Take 180 mg by mouth daily.  . fluticasone (FLONASE) 50 MCG/ACT nasal spray Place 2 sprays into both nostrils daily. In each nostril  . LATANOPROST OP Apply 1 drop to eye at bedtime. Both eyes  . rosuvastatin (CRESTOR) 10 MG tablet Take 1 tablet (10 mg total) by mouth daily.   Allergies  Allergen Reactions  . Contrast Media [Iodinated Diagnostic Agents] Rash   No results found for this or any previous visit (from the past 2160 hour(s)). Objective  Body mass index is 28.08 kg/m. Wt Readings from Last 3 Encounters:  01/02/18 204 lb  3.2 oz (92.6 kg)  11/29/17 202 lb (91.6 kg)  07/25/17 196 lb 6.4 oz (89.1 kg)   Temp Readings from Last 3 Encounters:  01/02/18 98 F (36.7 C) (Oral)  11/29/17 97.9 F (36.6 C) (Oral)  05/01/17 98.5 F (36.9 C) (Oral)   BP Readings from Last 3 Encounters:  01/02/18 124/80  11/29/17 130/78  07/25/17 124/80   Pulse Readings from Last 3 Encounters:  01/02/18 80  11/29/17 (!) 55  07/25/17 (!) 56    Physical Exam Vitals signs and nursing note reviewed.  Constitutional:      Appearance: Normal appearance.  HENT:     Head: Normocephalic and atraumatic.     Nose: Nose normal.     Mouth/Throat:     Mouth: Mucous membranes are moist.     Pharynx: Oropharynx is clear.  Eyes:     Conjunctiva/sclera: Conjunctivae normal.     Pupils: Pupils are equal, round, and reactive to light.  Cardiovascular:     Rate and Rhythm: Normal rate and regular rhythm.     Heart sounds:  Normal heart sounds.  Pulmonary:     Effort: Pulmonary effort is normal.     Breath sounds: Normal breath sounds. No wheezing.  Skin:    General: Skin is warm and dry.  Neurological:     General: No focal deficit present.     Mental Status: He is alert and oriented to person, place, and time.     Gait: Gait normal.  Psychiatric:        Attention and Perception: Attention and perception normal.        Mood and Affect: Mood normal.        Speech: Speech normal.        Behavior: Behavior normal. Behavior is cooperative.        Thought Content: Thought content normal.        Cognition and Memory: Cognition and memory normal.        Judgment: Judgment normal.     Assessment   1. URI/sinusitis Plan   1. augmentin Robitussin dm or mucinex dm  Call back if not better  Cold handout given   Provider: Dr. Olivia Mackie McLean-Scocuzza-Internal Medicine

## 2018-01-10 ENCOUNTER — Other Ambulatory Visit (INDEPENDENT_AMBULATORY_CARE_PROVIDER_SITE_OTHER): Payer: BLUE CROSS/BLUE SHIELD

## 2018-01-10 DIAGNOSIS — R739 Hyperglycemia, unspecified: Secondary | ICD-10-CM

## 2018-01-10 LAB — BASIC METABOLIC PANEL
BUN: 15 mg/dL (ref 6–23)
CALCIUM: 9.1 mg/dL (ref 8.4–10.5)
CO2: 31 meq/L (ref 19–32)
Chloride: 101 mEq/L (ref 96–112)
Creatinine, Ser: 1.04 mg/dL (ref 0.40–1.50)
GFR: 77.17 mL/min (ref >60.00)
GLUCOSE: 104 mg/dL — AB (ref 70–99)
Potassium: 4.1 mEq/L (ref 3.5–5.1)
Sodium: 138 mEq/L (ref 135–145)

## 2018-01-10 LAB — LIPID PANEL
CHOL/HDL RATIO: 3
Cholesterol: 117 mg/dL (ref 0–200)
HDL: 39.2 mg/dL (ref >39.00)
LDL Cholesterol: 62 mg/dL (ref 0–99)
NonHDL: 77.92
Triglycerides: 78 mg/dL (ref 0.0–149.0)
VLDL: 15.6 mg/dL (ref 0.0–40.0)

## 2018-01-10 LAB — HEPATIC FUNCTION PANEL
ALK PHOS: 72 U/L (ref 39–117)
ALT: 29 U/L (ref 0–53)
AST: 22 U/L (ref 0–37)
Albumin: 4.1 g/dL (ref 3.5–5.2)
BILIRUBIN TOTAL: 0.3 mg/dL (ref 0.2–1.2)
Bilirubin, Direct: 0.1 mg/dL (ref 0.0–0.3)
Total Protein: 7.2 g/dL (ref 6.0–8.3)

## 2018-01-10 LAB — HEMOGLOBIN A1C: HEMOGLOBIN A1C: 5.7 % (ref 4.6–6.5)

## 2018-01-12 ENCOUNTER — Encounter: Payer: Self-pay | Admitting: Internal Medicine

## 2018-02-10 DIAGNOSIS — D400 Neoplasm of uncertain behavior of prostate: Secondary | ICD-10-CM | POA: Diagnosis not present

## 2018-02-10 DIAGNOSIS — C61 Malignant neoplasm of prostate: Secondary | ICD-10-CM | POA: Diagnosis not present

## 2018-02-11 DIAGNOSIS — D400 Neoplasm of uncertain behavior of prostate: Secondary | ICD-10-CM | POA: Diagnosis not present

## 2018-02-11 DIAGNOSIS — C61 Malignant neoplasm of prostate: Secondary | ICD-10-CM | POA: Diagnosis not present

## 2018-03-31 ENCOUNTER — Ambulatory Visit (INDEPENDENT_AMBULATORY_CARE_PROVIDER_SITE_OTHER): Payer: BLUE CROSS/BLUE SHIELD | Admitting: Internal Medicine

## 2018-03-31 ENCOUNTER — Other Ambulatory Visit: Payer: Self-pay

## 2018-03-31 ENCOUNTER — Encounter: Payer: Self-pay | Admitting: Internal Medicine

## 2018-03-31 VITALS — BP 120/80 | HR 55 | Temp 98.2°F | Wt 200.8 lb

## 2018-03-31 DIAGNOSIS — Z8546 Personal history of malignant neoplasm of prostate: Secondary | ICD-10-CM

## 2018-03-31 DIAGNOSIS — R739 Hyperglycemia, unspecified: Secondary | ICD-10-CM | POA: Diagnosis not present

## 2018-03-31 DIAGNOSIS — Z0000 Encounter for general adult medical examination without abnormal findings: Secondary | ICD-10-CM | POA: Diagnosis not present

## 2018-03-31 DIAGNOSIS — Z1211 Encounter for screening for malignant neoplasm of colon: Secondary | ICD-10-CM

## 2018-03-31 DIAGNOSIS — E7800 Pure hypercholesterolemia, unspecified: Secondary | ICD-10-CM

## 2018-03-31 NOTE — Progress Notes (Addendum)
Patient ID: Jeremy Harvest., male   DOB: January 07, 1956, 63 y.o.   MRN: [id number]   Subjective:    Patient ID: Jeremy Harvest., male    DOB: [birth year], 63 y.o.   MRN: [id number]  HPI  Patient here for a scheduled physical.  States he is doing well.  Feels good.  Tries to stay active.  No chest pain.  No sob.  No acid reflux.  No abdominal pain.  Bowels moving.   States he is seeing his urologist regularly.  Has upcoming appt.  Overall feels good.     Past Medical History:  Diagnosis Date   Allergy    Glaucoma    Hyperlipidemia    Prostate CA [facility])    Past Surgical History:  Procedure Laterality Date   BACK SURGERY  03/02/04   GLAUCOMA SURGERY Right 2012   Family History  Problem Relation Age of Onset   Hyperlipidemia Mother    Prostate cancer Father    Bone cancer Father    Social History   Socioeconomic History   Marital status: Married    Spouse name: Not on file   Number of children: Not on file   Years of education: Not on file   Highest education level: Not on file  Occupational History   Not on file  Social Needs   Financial resource strain: Not on file   Food insecurity:    Worry: Not on file    Inability: Not on file   Transportation needs:    Medical: Not on file    Non-medical: Not on file  Tobacco Use   Smoking status: Never Smoker   Smokeless tobacco: Never Used  Substance and Sexual Activity   Alcohol use: No    Alcohol/week: 0.0 standard drinks   Drug use: No   Sexual activity: Not on file  Lifestyle   Physical activity:    Days per week: Not on file    Minutes per session: Not on file   Stress: Not on file  Relationships   Social connections:    Talks on phone: Not on file    Gets together: Not on file    Attends religious service: Not on file    Active member of club or organization: Not on file    Attends meetings of clubs or organizations: Not on file    Relationship status: Not on file  Other  Topics Concern   Not on file  Social History Narrative   Married with kids    Outpatient Encounter Medications as of 03/31/2018  Medication Sig   amoxicillin-clavulanate (AUGMENTIN) 875-125 MG tablet Take 1 tablet by mouth 2 (two) times daily.   dorzolamide-timolol (COSOPT) 22.3-6.8 MG/ML ophthalmic solution    fexofenadine (ALLEGRA) 180 MG tablet Take 180 mg by mouth daily.   fluticasone (FLONASE) 50 MCG/ACT nasal spray Place 2 sprays into both nostrils daily. In each nostril   LATANOPROST OP Apply 1 drop to eye at bedtime. Both eyes   rosuvastatin (CRESTOR) 10 MG tablet Take 1 tablet (10 mg total) by mouth daily.   No facility-administered encounter medications on file as of 03/31/2018.     Review of Systems  Constitutional: Negative for appetite change and unexpected weight change.  HENT: Negative for congestion and sinus pressure.   Eyes: Negative for pain and visual disturbance.  Respiratory: Negative for cough, chest tightness and shortness of breath.   Cardiovascular: Negative for chest pain, palpitations and leg swelling.  Gastrointestinal: Negative for  abdominal pain, diarrhea, nausea and vomiting.  Genitourinary: Negative for difficulty urinating and dysuria.  Musculoskeletal: Negative for joint swelling and myalgias.  Skin: Negative for color change.       Rash on lower legs. Has been evaluated by dermatology.  Using cream. Has f/u soon with dermatology.   Neurological: Negative for dizziness, light-headedness and headaches.  Hematological: Negative for adenopathy. Does not bruise/bleed easily.  Psychiatric/Behavioral: Negative for agitation and dysphoric mood.       Objective:    Physical Exam Constitutional:      General: He is not in acute distress.    Appearance: Normal appearance. He is well-developed.  HENT:     Head: Normocephalic and atraumatic.     Nose: Nose normal. No congestion.     Mouth/Throat:     Pharynx: No oropharyngeal exudate or  posterior oropharyngeal erythema.  Eyes:     General:        Right eye: No discharge.        Left eye: No discharge.     Conjunctiva/sclera: Conjunctivae normal.  Neck:     Musculoskeletal: Neck supple. No muscular tenderness.     Thyroid: No thyromegaly.  Cardiovascular:     Rate and Rhythm: Normal rate and regular rhythm.  Pulmonary:     Effort: No respiratory distress.     Breath sounds: Normal breath sounds. No wheezing.  Abdominal:     General: Bowel sounds are normal.     Palpations: Abdomen is soft.     Tenderness: There is no abdominal tenderness.  Genitourinary:    Comments: Performed by urology.  Musculoskeletal:        General: No tenderness.  Lymphadenopathy:     Cervical: No cervical adenopathy.  Skin:    General: Skin is warm.     Comments: Erythematous rash on lower legs.  No evidence of cellulitis.    Neurological:     Mental Status: He is alert and oriented to person, place, and time.  Psychiatric:        Mood and Affect: Mood normal.        Behavior: Behavior normal.     BP 120/80    Pulse (!) 55    Temp 98.2 F (36.8 C) (Oral)    Wt 200 lb 12.8 oz (91.1 kg)    SpO2 98%    BMI 27.62 kg/m  Wt Readings from Last 3 Encounters:  03/31/18 200 lb 12.8 oz (91.1 kg)  01/02/18 204 lb 3.2 oz (92.6 kg)  11/29/17 202 lb (91.6 kg)     Lab Results  Component Value Date   WBC 7.4 02/23/2017   HGB 13.9 02/23/2017   HCT 40.0 02/23/2017   PLT 190 02/23/2017   GLUCOSE 104 (H) 01/10/2018   CHOL 117 01/10/2018   TRIG 78.0 01/10/2018   HDL 39.20 01/10/2018   LDLCALC 62 01/10/2018   ALT 29 01/10/2018   AST 22 01/10/2018   NA 138 01/10/2018   K 4.1 01/10/2018   CL 101 01/10/2018   CREATININE 1.04 01/10/2018   BUN 15 01/10/2018   CO2 31 01/10/2018   TSH 2.94 08/05/2017   INR 1.0 12/19/2012   HGBA1C 5.7 01/10/2018    Ct Chest Wo Contrast  Result Date: 02/27/2017 CLINICAL DATA:  Possible lung nodule seen on recent chest x-ray. EXAM: CT CHEST WITHOUT  CONTRAST TECHNIQUE: Multidetector CT imaging of the chest was performed following the standard protocol without IV contrast. COMPARISON:  Chest x-ray dated February 23, 2017.  FINDINGS: Cardiovascular: No significant vascular findings. Normal heart size. No pericardial effusion. Normal caliber thoracic aorta. Mediastinum/Nodes: No enlarged mediastinal or axillary lymph nodes. Thyroid gland, trachea, and esophagus demonstrate no significant findings. Lungs/Pleura: Lungs are clear. No focal consolidation, pleural effusion, or pneumothorax. There are a few scattered sub 2 mm pulmonary nodules, benign. No suspicious pulmonary nodule. Upper Abdomen: No acute abnormality. Musculoskeletal: No acute or suspicious osseous finding. Degenerative changes of the thoracic spine. IMPRESSION: 1. No CT correlate for the nodular density seen on chest x-ray. No suspicious pulmonary nodule. Electronically Signed   By: Titus Dubin M.D.   On: 02/27/2017 13:40       Assessment & Plan:   Problem List Items Addressed This Visit    Health care maintenance    Physical today 03/31/18.  PSA and prostate checks through urology.  Colonoscopy 01/2012.  IFOB.        History of prostate cancer    Followed by urology.  States on a 6 month schedule currently. Doing well.  Need to obtain records.       Hypercholesterolemia    Low cholesterol diet and exercise.  On crestor.  Follow lipid panel and liver function tests.  Planning to get cholesterol checked through work (and other labs).  Will forward to me.  Hold on scheduling labs until review of those labs.      Hyperglycemia    Low carb diet and exercise.  Follow met b and a1c.         Other Visit Diagnoses    Routine general medical examination at a health care facility    -  Primary   Colon cancer screening       Relevant Orders   Fecal occult blood, imunochemical       Jeremy Pheasant, MD

## 2018-03-31 NOTE — Assessment & Plan Note (Addendum)
Physical today 03/31/18.  PSA and prostate checks through urology.  Colonoscopy 01/2012.  IFOB.

## 2018-03-31 NOTE — Progress Notes (Signed)
physical

## 2018-04-01 ENCOUNTER — Encounter: Payer: Self-pay | Admitting: Internal Medicine

## 2018-04-01 DIAGNOSIS — E7800 Pure hypercholesterolemia, unspecified: Secondary | ICD-10-CM | POA: Insufficient documentation

## 2018-04-01 NOTE — Assessment & Plan Note (Signed)
Low carb diet and exercise.  Follow met b and a1c.   

## 2018-04-01 NOTE — Assessment & Plan Note (Addendum)
Low cholesterol diet and exercise.  On crestor.  Follow lipid panel and liver function tests.  Planning to get cholesterol checked through work (and other labs).  Will forward to me.  Hold on scheduling labs until review of those labs.

## 2018-04-01 NOTE — Assessment & Plan Note (Signed)
Followed by urology.  States on a 6 month schedule currently. Doing well.  Need to obtain records.

## 2018-06-03 DIAGNOSIS — L570 Actinic keratosis: Secondary | ICD-10-CM | POA: Diagnosis not present

## 2018-06-03 DIAGNOSIS — L821 Other seborrheic keratosis: Secondary | ICD-10-CM | POA: Diagnosis not present

## 2018-06-03 DIAGNOSIS — L400 Psoriasis vulgaris: Secondary | ICD-10-CM | POA: Diagnosis not present

## 2018-07-01 ENCOUNTER — Other Ambulatory Visit: Payer: Self-pay | Admitting: Internal Medicine

## 2018-07-08 DIAGNOSIS — H401133 Primary open-angle glaucoma, bilateral, severe stage: Secondary | ICD-10-CM | POA: Diagnosis not present

## 2018-07-15 DIAGNOSIS — H401133 Primary open-angle glaucoma, bilateral, severe stage: Secondary | ICD-10-CM | POA: Diagnosis not present

## 2018-07-28 DIAGNOSIS — C61 Malignant neoplasm of prostate: Secondary | ICD-10-CM | POA: Diagnosis not present

## 2018-07-28 DIAGNOSIS — D400 Neoplasm of uncertain behavior of prostate: Secondary | ICD-10-CM | POA: Diagnosis not present

## 2018-09-30 ENCOUNTER — Other Ambulatory Visit: Payer: Self-pay

## 2018-10-02 ENCOUNTER — Other Ambulatory Visit: Payer: Self-pay

## 2018-10-02 ENCOUNTER — Encounter: Payer: Self-pay | Admitting: Internal Medicine

## 2018-10-02 ENCOUNTER — Ambulatory Visit (INDEPENDENT_AMBULATORY_CARE_PROVIDER_SITE_OTHER): Payer: BC Managed Care – PPO | Admitting: Internal Medicine

## 2018-10-02 DIAGNOSIS — Z8546 Personal history of malignant neoplasm of prostate: Secondary | ICD-10-CM | POA: Diagnosis not present

## 2018-10-02 DIAGNOSIS — R739 Hyperglycemia, unspecified: Secondary | ICD-10-CM

## 2018-10-02 DIAGNOSIS — E7800 Pure hypercholesterolemia, unspecified: Secondary | ICD-10-CM

## 2018-10-02 NOTE — Progress Notes (Signed)
[name]., male   DOB: 05/02/1955, 63 y.o.   MRN: [id number]   Subjective:    [name]., male    DOB: 07-18-1955, 63 y.o.   MRN: [id number]  HPI  Patient here for a scheduled follow up.  He reports he is doing well.  Working a lot. Feels he is handling things well.  Trying to stay active.  No chest pain.  No sob.  No acid reflux.  No abdominal pain.  Bowels moving.  Sees urology for f/u prostate cancer.     Past Medical History:  Diagnosis Date  . Allergy   . Glaucoma   . Hyperlipidemia   . Prostate CA [facility])    Past Surgical History:  Procedure Laterality Date  . BACK SURGERY  03/02/04  . GLAUCOMA SURGERY Right 2012   Family History  Problem Relation Age of Onset  . Hyperlipidemia Mother   . Prostate cancer Father   . Bone cancer Father    Social History   Socioeconomic History  . Marital status: Married    Spouse name: Not on file  . Number of children: Not on file  . Years of education: Not on file  . Highest education level: Not on file  Occupational History  . Not on file  Social Needs  . Financial resource strain: Not on file  . Food insecurity    Worry: Not on file    Inability: Not on file  . Transportation needs    Medical: Not on file    Non-medical: Not on file  Tobacco Use  . Smoking status: Never Smoker  . Smokeless tobacco: Never Used  Substance and Sexual Activity  . Alcohol use: No    Alcohol/week: 0.0 standard drinks  . Drug use: No  . Sexual activity: Not on file  Lifestyle  . Physical activity    Days per week: Not on file    Minutes per session: Not on file  . Stress: Not on file  Relationships  . Social Herbalist on phone: Not on file    Gets together: Not on file    Attends religious service: Not on file    Active member of club or organization: Not on file    Attends meetings of clubs or organizations: Not on file    Relationship status: Not on file  Other Topics  Concern  . Not on file  Social History Narrative   Married with kids    Outpatient Encounter Medications as of 10/02/2018  Medication Sig  . amoxicillin-clavulanate (AUGMENTIN) 875-125 MG tablet Take 1 tablet by mouth 2 (two) times daily.  . dorzolamide-timolol (COSOPT) 22.3-6.8 MG/ML ophthalmic solution   . fexofenadine (ALLEGRA) 180 MG tablet Take 180 mg by mouth daily.  . fluticasone (FLONASE) 50 MCG/ACT nasal spray Place 2 sprays into both nostrils daily. In each nostril  . LATANOPROST OP Apply 1 drop to eye at bedtime. Both eyes  . rosuvastatin (CRESTOR) 10 MG tablet TAKE 1 TABLET DAILY   No facility-administered encounter medications on file as of 10/02/2018.     Review of Systems  Constitutional: Negative for appetite change and unexpected weight change.  HENT: Negative for congestion and sinus pressure.   Respiratory: Negative for cough, chest tightness and shortness of breath.   Cardiovascular: Negative for chest pain, palpitations and leg swelling.  Gastrointestinal: Negative for abdominal pain, diarrhea, nausea and vomiting.  Genitourinary: Negative for difficulty urinating and dysuria.  Musculoskeletal: Negative for joint swelling and myalgias.  Skin: Negative for color change and rash.  Neurological: Negative for dizziness, light-headedness and headaches.  Psychiatric/Behavioral: Negative for agitation and dysphoric mood.       Objective:    Physical Exam Constitutional:      General: He is not in acute distress.    Appearance: Normal appearance. He is well-developed.  HENT:     Right Ear: External ear normal.     Left Ear: External ear normal.  Eyes:     General: No scleral icterus.       Right eye: No discharge.        Left eye: No discharge.     Conjunctiva/sclera: Conjunctivae normal.  Cardiovascular:     Rate and Rhythm: Normal rate and regular rhythm.  Pulmonary:     Effort: Pulmonary effort is normal. No respiratory distress.     Breath sounds:  Normal breath sounds.  Abdominal:     General: Bowel sounds are normal.     Palpations: Abdomen is soft.     Tenderness: There is no abdominal tenderness.  Musculoskeletal:        General: No swelling or tenderness.  Neurological:     Mental Status: He is alert.  Psychiatric:        Mood and Affect: Mood normal.        Behavior: Behavior normal.     BP 130/80   Pulse (!) 57   Temp 97.7 F (36.5 C) (Temporal)   Ht 5' 8" (1.727 m)   Wt 197 lb 12.8 oz (89.7 kg)   SpO2 99%   BMI 30.08 kg/m  Wt Readings from Last 3 Encounters:  10/02/18 197 lb 12.8 oz (89.7 kg)  03/31/18 200 lb 12.8 oz (91.1 kg)  01/02/18 204 lb 3.2 oz (92.6 kg)     Lab Results  Component Value Date   WBC 7.4 02/23/2017   HGB 13.9 02/23/2017   HCT 40.0 02/23/2017   PLT 190 02/23/2017   GLUCOSE 104 (H) 01/10/2018   CHOL 117 01/10/2018   TRIG 78.0 01/10/2018   HDL 39.20 01/10/2018   LDLCALC 62 01/10/2018   ALT 29 01/10/2018   AST 22 01/10/2018   NA 138 01/10/2018   K 4.1 01/10/2018   CL 101 01/10/2018   CREATININE 1.04 01/10/2018   BUN 15 01/10/2018   CO2 31 01/10/2018   TSH 2.94 08/05/2017   INR 1.0 12/19/2012   HGBA1C 5.7 01/10/2018    Ct Chest Wo Contrast  Result Date: 02/27/2017 CLINICAL DATA:  Possible lung nodule seen on recent chest x-ray. EXAM: CT CHEST WITHOUT CONTRAST TECHNIQUE: Multidetector CT imaging of the chest was performed following the standard protocol without IV contrast. COMPARISON:  Chest x-ray dated February 23, 2017. FINDINGS: Cardiovascular: No significant vascular findings. Normal heart size. No pericardial effusion. Normal caliber thoracic aorta. Mediastinum/Nodes: No enlarged mediastinal or axillary lymph nodes. Thyroid gland, trachea, and esophagus demonstrate no significant findings. Lungs/Pleura: Lungs are clear. No focal consolidation, pleural effusion, or pneumothorax. There are a few scattered sub 2 mm pulmonary nodules, benign. No suspicious pulmonary nodule. Upper  Abdomen: No acute abnormality. Musculoskeletal: No acute or suspicious osseous finding. Degenerative changes of the thoracic spine. IMPRESSION: 1. No CT correlate for the nodular density seen on chest x-ray. No suspicious pulmonary nodule. Electronically Signed   By: Titus Dubin M.D.   On: 02/27/2017 13:40       Assessment & Plan:   Problem List Items Addressed  This Visit    History of prostate cancer    Followed by urology.        Hypercholesterolemia    On crestor.  Low cholesterol diet and exercise.  Follow lipid panel and liver function tests.        Relevant Orders   CBC with Differential/Platelet   Hepatic function panel   Lipid panel   TSH   Hyperglycemia    Low carb diet and exercise.  Follow met b and a1c.        Relevant Orders   Hemoglobin G9Q   Basic metabolic panel       Einar Pheasant, MD

## 2018-10-05 ENCOUNTER — Encounter: Payer: Self-pay | Admitting: Internal Medicine

## 2018-10-05 NOTE — Assessment & Plan Note (Signed)
Low carb diet and exercise.  Follow met b and a1c.   

## 2018-10-05 NOTE — Assessment & Plan Note (Signed)
On crestor.  Low cholesterol diet and exercise.  Follow lipid panel and liver function tests.   

## 2018-10-05 NOTE — Assessment & Plan Note (Signed)
Followed by urology.   

## 2018-10-23 ENCOUNTER — Other Ambulatory Visit (INDEPENDENT_AMBULATORY_CARE_PROVIDER_SITE_OTHER): Payer: BC Managed Care – PPO

## 2018-10-23 ENCOUNTER — Other Ambulatory Visit: Payer: Self-pay

## 2018-10-23 DIAGNOSIS — E7800 Pure hypercholesterolemia, unspecified: Secondary | ICD-10-CM | POA: Diagnosis not present

## 2018-10-23 DIAGNOSIS — R739 Hyperglycemia, unspecified: Secondary | ICD-10-CM

## 2018-10-23 LAB — CBC WITH DIFFERENTIAL/PLATELET
Basophils Absolute: 0.1 10*3/uL (ref 0.0–0.1)
Basophils Relative: 1 % (ref 0.0–3.0)
Eosinophils Absolute: 0.4 10*3/uL (ref 0.0–0.7)
Eosinophils Relative: 5.3 % — ABNORMAL HIGH (ref 0.0–5.0)
HCT: 41.8 % (ref 39.0–52.0)
Hemoglobin: 14.4 g/dL (ref 13.0–17.0)
Lymphocytes Relative: 26.7 % (ref 12.0–46.0)
Lymphs Abs: 1.8 10*3/uL (ref 0.7–4.0)
MCHC: 34.5 g/dL (ref 30.0–36.0)
MCV: 93.6 fl (ref 78.0–100.0)
Monocytes Absolute: 0.5 10*3/uL (ref 0.1–1.0)
Monocytes Relative: 7.2 % (ref 3.0–12.0)
Neutro Abs: 4.1 10*3/uL (ref 1.4–7.7)
Neutrophils Relative %: 59.8 % (ref 43.0–77.0)
Platelets: 201 10*3/uL (ref 150.0–400.0)
RBC: 4.47 Mil/uL (ref 4.22–5.81)
RDW: 13.1 % (ref 11.5–15.5)
WBC: 6.8 10*3/uL (ref 4.0–10.5)

## 2018-10-23 LAB — HEPATIC FUNCTION PANEL
ALT: 23 U/L (ref 0–53)
AST: 18 U/L (ref 0–37)
Albumin: 4.2 g/dL (ref 3.5–5.2)
Alkaline Phosphatase: 63 U/L (ref 39–117)
Bilirubin, Direct: 0.1 mg/dL (ref 0.0–0.3)
Total Bilirubin: 0.6 mg/dL (ref 0.2–1.2)
Total Protein: 6.8 g/dL (ref 6.0–8.3)

## 2018-10-23 LAB — LIPID PANEL
Cholesterol: 116 mg/dL (ref 0–200)
HDL: 41.7 mg/dL (ref >39.00)
LDL Cholesterol: 57 mg/dL (ref 0–99)
NonHDL: 73.93
Total CHOL/HDL Ratio: 3
Triglycerides: 87 mg/dL (ref 0.0–149.0)
VLDL: 17.4 mg/dL (ref 0.0–40.0)

## 2018-10-23 LAB — BASIC METABOLIC PANEL
BUN: 14 mg/dL (ref 6–23)
CO2: 29 mEq/L (ref 19–32)
Calcium: 9.1 mg/dL (ref 8.4–10.5)
Chloride: 102 mEq/L (ref 96–112)
Creatinine, Ser: 0.97 mg/dL (ref 0.40–1.50)
GFR: 78.48 mL/min (ref >60.00)
Glucose, Bld: 100 mg/dL — ABNORMAL HIGH (ref 70–99)
Potassium: 4.1 mEq/L (ref 3.5–5.1)
Sodium: 139 mEq/L (ref 135–145)

## 2018-10-23 LAB — TSH: TSH: 4 u[IU]/mL (ref 0.35–4.50)

## 2018-10-23 LAB — HEMOGLOBIN A1C: Hgb A1c MFr Bld: 5.9 % (ref 4.6–6.5)

## 2018-10-24 ENCOUNTER — Encounter: Payer: Self-pay | Admitting: Internal Medicine

## 2018-11-13 DIAGNOSIS — H401133 Primary open-angle glaucoma, bilateral, severe stage: Secondary | ICD-10-CM | POA: Diagnosis not present

## 2018-12-28 ENCOUNTER — Other Ambulatory Visit: Payer: Self-pay | Admitting: Internal Medicine

## 2019-01-02 ENCOUNTER — Ambulatory Visit (INDEPENDENT_AMBULATORY_CARE_PROVIDER_SITE_OTHER): Payer: BC Managed Care – PPO | Admitting: Internal Medicine

## 2019-01-02 ENCOUNTER — Other Ambulatory Visit: Payer: Self-pay

## 2019-01-02 DIAGNOSIS — R739 Hyperglycemia, unspecified: Secondary | ICD-10-CM | POA: Diagnosis not present

## 2019-01-02 DIAGNOSIS — Z8546 Personal history of malignant neoplasm of prostate: Secondary | ICD-10-CM | POA: Diagnosis not present

## 2019-01-02 DIAGNOSIS — E7800 Pure hypercholesterolemia, unspecified: Secondary | ICD-10-CM

## 2019-01-02 DIAGNOSIS — Z9109 Other allergy status, other than to drugs and biological substances: Secondary | ICD-10-CM | POA: Diagnosis not present

## 2019-01-02 NOTE — Progress Notes (Signed)
[name]., male   DOB: December 18, 1955, 63 y.o.   MRN: [id number]   Virtual Visit via video Note  This visit type was conducted due to national recommendations for restrictions regarding the COVID-19 pandemic (e.g. social distancing).  This format is felt to be most appropriate for this patient at this time.  All issues noted in this document were discussed and addressed.  No physical exam was performed (except for noted visual exam findings with Video Visits).   I connected with Cristopher Estimable today by a video enabled telemedicine application and verified that I am speaking with the correct person using two identifiers. Location patient: home Location provider: work  Persons participating in the virtual visit: patient, provider  The limitations, risks, security and privacy concerns of performing an evaluation and management service by video and the availability of in person appointments have been discussed. The patient expressed understanding and agreed to proceed.   Reason for visit: scheduled follow up.    HPI: He reports he is doing well.  Feels good.  Working.  Tries to stay active.  No chest pain or sob reported.  Eating.  No nausea, vomiting or abdominal pain reported.  Handling stress.  Followed by Dr Yves Dill - history of prostate cancer.  Due f/u in 01/2019.  Discussed diet and exercise.     ROS: See pertinent positives and negatives per HPI.  Past Medical History:  Diagnosis Date  . Allergy   . Glaucoma   . Hyperlipidemia   . Prostate CA [facility])     Past Surgical History:  Procedure Laterality Date  . BACK SURGERY  03/02/04  . GLAUCOMA SURGERY Right 2012    Family History  Problem Relation Age of Onset  . Hyperlipidemia Mother   . Prostate cancer Father   . Bone cancer Father     SOCIAL HX: reviewed.     Current Outpatient Medications:  .  dorzolamide-timolol (COSOPT) 22.3-6.8 MG/ML ophthalmic solution, , Disp: , Rfl:  .  fexofenadine (ALLEGRA)  180 MG tablet, Take 180 mg by mouth daily., Disp: , Rfl:  .  fluticasone (FLONASE) 50 MCG/ACT nasal spray, Place 2 sprays into both nostrils daily. In each nostril, Disp: 16 g, Rfl: 12 .  LATANOPROST OP, Apply 1 drop to eye at bedtime. Both eyes, Disp: , Rfl:  .  rosuvastatin (CRESTOR) 10 MG tablet, TAKE 1 TABLET DAILY, Disp: 90 tablet, Rfl: 3  EXAM:  GENERAL: alert, oriented, appears well and in no acute distress  HEENT: atraumatic, conjunttiva clear, no obvious abnormalities on inspection of external nose and ears  NECK: normal movements of the head and neck  LUNGS: on inspection no signs of respiratory distress, breathing rate appears normal, no obvious gross SOB, gasping or wheezing  CV: no obvious cyanosis  PSYCH/NEURO: pleasant and cooperative, no obvious depression or anxiety, speech and thought processing grossly intact  ASSESSMENT AND PLAN:  Discussed the following assessment and plan:  History of prostate cancer Followed by urology.  Has f/u scheduled in 01/2019.    Environmental allergies Controlled.    Hypercholesterolemia On crestor.  Low cholesterol diet and exercise.  Follow lipid panel and liver function tests.    Hyperglycemia Low carb diet and exercise.  Follow met b and a1c.    I discussed the assessment and treatment plan with the patient. The patient was provided an opportunity to ask questions and all were answered. The patient agreed with the plan and demonstrated an understanding of the instructions.  The patient was advised to call back or seek an in-person evaluation if the symptoms worsen or if the condition fails to improve as anticipated.   Einar Pheasant, MD

## 2019-01-10 ENCOUNTER — Encounter: Payer: Self-pay | Admitting: Internal Medicine

## 2019-01-10 NOTE — Assessment & Plan Note (Signed)
On crestor.  Low cholesterol diet and exercise.  Follow lipid panel and liver function tests.   

## 2019-01-10 NOTE — Assessment & Plan Note (Signed)
Controlled.  

## 2019-01-10 NOTE — Assessment & Plan Note (Signed)
Low carb diet and exercise.  Follow met b and a1c.

## 2019-01-10 NOTE — Assessment & Plan Note (Signed)
Followed by urology.  Has f/u scheduled in 01/2019.

## 2019-02-02 DIAGNOSIS — D400 Neoplasm of uncertain behavior of prostate: Secondary | ICD-10-CM | POA: Diagnosis not present

## 2019-02-02 DIAGNOSIS — C61 Malignant neoplasm of prostate: Secondary | ICD-10-CM | POA: Diagnosis not present

## 2019-03-17 DIAGNOSIS — H401133 Primary open-angle glaucoma, bilateral, severe stage: Secondary | ICD-10-CM | POA: Diagnosis not present

## 2019-03-27 ENCOUNTER — Ambulatory Visit: Payer: BC Managed Care – PPO

## 2019-03-29 ENCOUNTER — Ambulatory Visit
Admission: EM | Admit: 2019-03-29 | Discharge: 2019-03-29 | Disposition: A | Discharge status: Home / Self Care | Payer: BC Managed Care – PPO | Attending: Family Medicine | Admitting: Family Medicine

## 2019-03-29 ENCOUNTER — Other Ambulatory Visit: Payer: Self-pay

## 2019-03-29 DIAGNOSIS — M25551 Pain in right hip: Secondary | ICD-10-CM | POA: Diagnosis not present

## 2019-03-29 MED ORDER — HYDROCODONE-ACETAMINOPHEN 5-325 MG PO TABS: ORAL_TABLET | ORAL | 0 refills | Status: DC

## 2019-03-29 MED ORDER — PREDNISONE 10 MG PO TABS: ORAL_TABLET | ORAL | 0 refills | Status: DC

## 2019-03-29 NOTE — ED Provider Notes (Signed)
[facility]    CSN: [id number] Arrival date & time: 03/29/19  1134      History   Chief Complaint Chief Complaint  Patient presents with  . Hip Pain    HPI Jeremy Fischer. is a 64 y.o. male.   64 yo male with a c/o right hip pain since last week. Denies any falls or other injuries. Denies pain radiating, numbness/tingling, fevers, chills, back pain.      Past Medical History:  Diagnosis Date  . Allergy   . Glaucoma   . Hyperlipidemia   . Prostate CA [facility])     Patient Active Problem List   Diagnosis Date Noted  . Hypercholesterolemia 04/01/2018  . Acute non-recurrent sinusitis 05/01/2017  . History of prostate cancer 04/24/2015  . Pre-op evaluation 04/24/2015  . Health care maintenance 10/24/2014  . Rash 10/24/2014  . Elevated blood pressure reading 10/18/2013  . Abnormal CT scan, neck 05/10/2013  . Hyperglycemia 05/10/2013  . BPH (benign prostatic hypertrophy) 10/22/2012  . Glaucoma 10/15/2012  . Environmental allergies 10/15/2012    Past Surgical History:  Procedure Laterality Date  . BACK SURGERY  03/02/04  . GLAUCOMA SURGERY Right 2012       Home Medications    Prior to Admission medications   Medication Sig Start Date End Date Taking? Authorizing Provider  dorzolamide-timolol (COSOPT) 22.3-6.8 MG/ML ophthalmic solution  11/03/12  Yes [provider]  fexofenadine (ALLEGRA) 180 MG tablet Take 180 mg by mouth daily.   Yes [provider]  fluticasone (FLONASE) 50 MCG/ACT nasal spray Place 2 sprays into both nostrils daily. In each nostril 05/01/17  Yes Venia Carbon, MD  LATANOPROST OP Apply 1 drop to eye at bedtime. Both eyes   Yes [provider]  rosuvastatin (CRESTOR) 10 MG tablet TAKE 1 TABLET DAILY 12/29/18  Yes Einar Pheasant, MD  HYDROcodone-acetaminophen (NORCO/VICODIN) 5-325 MG tablet 1-2 tabs po bid prn 03/29/19   Norval Gable, MD  predniSONE (DELTASONE) 10 MG tablet Start 60 mg po day  one, then 50 mg po day two, taper by 10 mg daily until complete. 03/29/19   Norval Gable, MD    Family History Family History  Problem Relation Age of Onset  . Hyperlipidemia Mother   . Prostate cancer Father   . Bone cancer Father     Social History Social History   Tobacco Use  . Smoking status: Never Smoker  . Smokeless tobacco: Never Used  Substance Use Topics  . Alcohol use: No    Alcohol/week: 0.0 standard drinks  . Drug use: No     Allergies   Contrast media [iodinated diagnostic agents]   Review of Systems Review of Systems   Physical Exam Triage Vital Signs ED Triage Vitals  Enc Vitals Group     BP 03/29/19 1146 (!) 156/79     Pulse Rate 03/29/19 1146 (!) 56     Resp 03/29/19 1146 18     Temp 03/29/19 1146 98.1 F (36.7 C)     Temp Source 03/29/19 1146 Oral     SpO2 03/29/19 1146 100 %     Weight 03/29/19 1143 203 lb (92.1 kg)     Height 03/29/19 1143 5\' 11"  (1.803 m)     Head Circumference --      Peak Flow --      Pain Score 03/29/19 1143 10     Pain Loc --      Pain Edu? --  Excl. in GC? --    No data found.  Updated Vital Signs BP (!) 156/79 (BP Location: Left Arm)   Pulse (!) 56   Temp 98.1 F (36.7 C) (Oral)   Resp 18   Ht 5\' 11"  (1.803 m)   Wt 92.1 kg   SpO2 100%   BMI 28.31 kg/m   Visual Acuity Right Eye Distance:   Left Eye Distance:   Bilateral Distance:    Right Eye Near:   Left Eye Near:    Bilateral Near:     Physical Exam Vitals and nursing note reviewed.  Constitutional:      General: He is not in acute distress.    Appearance: He is not toxic-appearing or diaphoretic.  Musculoskeletal:     Right hip: No deformity, lacerations, tenderness, bony tenderness or crepitus. Normal range of motion. Normal strength.       Legs:     Comments: Tenderness to palpation (reproducible) to the gluteus medius and piriformis muscles area  Neurological:     Mental Status: He is alert.      UC Treatments / Results    Labs (all labs ordered are listed, but only abnormal results are displayed) Labs Reviewed - No data to display  EKG   Radiology No results found.  Procedures Procedures (including critical care time)  Medications Ordered in UC Medications - No data to display  Initial Impression / Assessment and Plan / UC Course  I have reviewed the triage vital signs and the nursing notes.  Pertinent labs & imaging results that were available during my care of the patient were reviewed by me and considered in my medical decision making (see chart for details).     Final Clinical Impressions(s) / UC Diagnoses   Final diagnoses:  Right hip pain     Discharge Instructions     Rest, ice/heat, stretches    ED Prescriptions    Medication Sig Dispense Auth. Provider   predniSONE (DELTASONE) 10 MG tablet Start 60 mg po day one, then 50 mg po day two, taper by 10 mg daily until complete. 21 tablet Norval Gable, MD   HYDROcodone-acetaminophen (NORCO/VICODIN) 5-325 MG tablet 1-2 tabs po bid prn 8 tablet Norval Gable, MD      1. Labs/x-ray results and diagnosis reviewed with patient 2. rx as per orders above; reviewed possible side effects, interactions, risks and benefits  3. Recommend supportive treatment as above 4. Follow-up prn if symptoms worsen or don't improve   I have reviewed the PDMP during this encounter.   Norval Gable, MD 03/29/19 1428

## 2019-03-29 NOTE — ED Triage Notes (Signed)
Pt presents with c/o right hip pain that started last week and is increasing. Pt thought it might be arthritis. Pt denies any known injuries.

## 2019-03-29 NOTE — Discharge Instructions (Signed)
Rest, ice/heat, stretches

## 2019-04-12 ENCOUNTER — Ambulatory Visit: Payer: BLUE CROSS/BLUE SHIELD | Attending: Internal Medicine

## 2019-04-12 DIAGNOSIS — Z23 Encounter for immunization: Secondary | ICD-10-CM

## 2019-04-12 NOTE — Progress Notes (Addendum)
   [id number]  Name:  Jeremy Fischer.    MRN: [id number] DOB: 1955/03/27  04/12/2019  [name] was observed post Covid-19 immunization for 30 minutes without incident. He was provided with Vaccine Information Sheet and instruction to access the V-Safe system.   [name] was instructed to call 911 with any severe reactions post vaccine: Marland Kitchen Difficulty breathing  . Swelling of face and throat  . A fast heartbeat  . A bad rash all over body  . Dizziness and weakness   Immunizations Administered    Name Date Dose VIS Date Route   Pfizer COVID-19 Vaccine 04/12/2019  9:28 AM 0.3 mL 12/26/2018 Intramuscular   Manufacturer: [place]   Lot: [id number]   [place]: [id number]

## 2019-05-05 ENCOUNTER — Ambulatory Visit: Payer: BLUE CROSS/BLUE SHIELD

## 2019-05-05 ENCOUNTER — Ambulatory Visit: Payer: BLUE CROSS/BLUE SHIELD | Attending: Internal Medicine

## 2019-05-05 DIAGNOSIS — Z23 Encounter for immunization: Secondary | ICD-10-CM

## 2019-05-05 NOTE — Progress Notes (Signed)
   [id number] [facility]  Name:  Jeremy Fischer.    MRN: [id number] DOB: 07/20/1955  05/05/2019  Jeremy Fischer was observed post Covid-19 immunization for 15 minutes without incident. He was provided with Vaccine Information Sheet and instruction to access the V-Safe system.   Jeremy Fischer was instructed to call 911 with any severe reactions post vaccine: Marland Kitchen Difficulty breathing  . Swelling of face and throat  . A fast heartbeat  . A bad rash all over body  . Dizziness and weakness   Immunizations Administered    Name Date Dose VIS Date Route   Pfizer COVID-19 Vaccine 05/05/2019  4:10 PM 0.3 mL 03/11/2018 Intramuscular   Manufacturer: [place]   Lot: [id number]   [place]: [id number]

## 2019-06-03 DIAGNOSIS — L821 Other seborrheic keratosis: Secondary | ICD-10-CM | POA: Diagnosis not present

## 2019-06-03 DIAGNOSIS — D225 Melanocytic nevi of trunk: Secondary | ICD-10-CM | POA: Diagnosis not present

## 2019-06-03 DIAGNOSIS — D2261 Melanocytic nevi of right upper limb, including shoulder: Secondary | ICD-10-CM | POA: Diagnosis not present

## 2019-06-03 DIAGNOSIS — D2262 Melanocytic nevi of left upper limb, including shoulder: Secondary | ICD-10-CM | POA: Diagnosis not present

## 2019-06-03 DIAGNOSIS — L820 Inflamed seborrheic keratosis: Secondary | ICD-10-CM | POA: Diagnosis not present

## 2019-06-03 DIAGNOSIS — L570 Actinic keratosis: Secondary | ICD-10-CM | POA: Diagnosis not present

## 2019-06-03 DIAGNOSIS — L538 Other specified erythematous conditions: Secondary | ICD-10-CM | POA: Diagnosis not present

## 2019-08-03 DIAGNOSIS — C61 Malignant neoplasm of prostate: Secondary | ICD-10-CM | POA: Diagnosis not present

## 2019-08-03 DIAGNOSIS — D400 Neoplasm of uncertain behavior of prostate: Secondary | ICD-10-CM | POA: Diagnosis not present

## 2019-08-11 ENCOUNTER — Other Ambulatory Visit: Payer: Self-pay

## 2019-08-11 ENCOUNTER — Ambulatory Visit (INDEPENDENT_AMBULATORY_CARE_PROVIDER_SITE_OTHER): Payer: BC Managed Care – PPO | Admitting: Internal Medicine

## 2019-08-11 VITALS — BP 136/80 | HR 56 | Temp 98.6°F | Resp 16 | Ht 68.0 in | Wt 198.0 lb

## 2019-08-11 DIAGNOSIS — Z8546 Personal history of malignant neoplasm of prostate: Secondary | ICD-10-CM | POA: Diagnosis not present

## 2019-08-11 DIAGNOSIS — Z0000 Encounter for general adult medical examination without abnormal findings: Secondary | ICD-10-CM | POA: Diagnosis not present

## 2019-08-11 DIAGNOSIS — E7800 Pure hypercholesterolemia, unspecified: Secondary | ICD-10-CM | POA: Diagnosis not present

## 2019-08-11 DIAGNOSIS — R739 Hyperglycemia, unspecified: Secondary | ICD-10-CM | POA: Diagnosis not present

## 2019-08-11 NOTE — Progress Notes (Signed)
Patient ID: Jeremy Harvest., male   DOB: Oct 20, 1955, 64 y.o.   MRN: [id number]   Subjective:    Patient ID: Jeremy Harvest., male    DOB: 1955-09-01, 64 y.o.   MRN: [id number]  HPI This visit occurred during the SARS-CoV-2 public health emergency.  Safety protocols were in place, including screening questions prior to the visit, additional usage of staff PPE, and extensive cleaning of exam room while observing appropriate contact time as indicated for disinfecting solutions.  Patient here for his physical exam.  He reports he is doing well.  Feels good.  Stays active.  No chest pain or sob with increased activity or exertion.  No acid reflux reported.  No abdominal pain.  Bowels moving.  Saw Dr Yves Dill last week.  Stable.  Recommended f/u in 6 months.  Handling stress.  Working.    Past Medical History:  Diagnosis Date  . Allergy   . Glaucoma   . Hyperlipidemia   . Prostate CA [facility])    Past Surgical History:  Procedure Laterality Date  . BACK SURGERY  03/02/04  . GLAUCOMA SURGERY Right 2012   Family History  Problem Relation Age of Onset  . Hyperlipidemia Mother   . Prostate cancer Father   . Bone cancer Father    Social History   Socioeconomic History  . Marital status: Married    Spouse name: Not on file  . Number of children: Not on file  . Years of education: Not on file  . Highest education level: Not on file  Occupational History  . Not on file  Tobacco Use  . Smoking status: Never Smoker  . Smokeless tobacco: Never Used  Vaping Use  . Vaping Use: Never used  Substance and Sexual Activity  . Alcohol use: No    Alcohol/week: 0.0 standard drinks  . Drug use: No  . Sexual activity: Not on file  Other Topics Concern  . Not on file  Social History Narrative   Married with kids   Social Determinants of Health   Financial Resource Strain:   . Difficulty of Paying Living Expenses:   Food Insecurity:   . Worried About Charity fundraiser in the Last  Year:   . Arboriculturist in the Last Year:   Transportation Needs:   . Film/video editor (Medical):   Marland Kitchen Lack of Transportation (Non-Medical):   Physical Activity:   . Days of Exercise per Week:   . Minutes of Exercise per Session:   Stress:   . Feeling of Stress :   Social Connections:   . Frequency of Communication with Friends and Family:   . Frequency of Social Gatherings with Friends and Family:   . Attends Religious Services:   . Active Member of Clubs or Organizations:   . Attends Archivist Meetings:   Marland Kitchen Marital Status:     Outpatient Encounter Medications as of 08/11/2019  Medication Sig  . dorzolamide-timolol (COSOPT) 22.3-6.8 MG/ML ophthalmic solution   . fexofenadine (ALLEGRA) 180 MG tablet Take 180 mg by mouth daily.  . fluticasone (FLONASE) 50 MCG/ACT nasal spray Place 2 sprays into both nostrils daily. In each nostril  . LATANOPROST OP Apply 1 drop to eye at bedtime. Both eyes  . rosuvastatin (CRESTOR) 10 MG tablet TAKE 1 TABLET DAILY  . [DISCONTINUED] HYDROcodone-acetaminophen (NORCO/VICODIN) 5-325 MG tablet 1-2 tabs po bid prn  . [DISCONTINUED] predniSONE (DELTASONE) 10 MG tablet Start 60 mg po day one,  then 50 mg po day two, taper by 10 mg daily until complete.   No facility-administered encounter medications on file as of 08/11/2019.    Review of Systems  Constitutional: Negative for appetite change and unexpected weight change.  HENT: Negative for congestion and sinus pressure.   Eyes: Negative for pain and visual disturbance.  Respiratory: Negative for cough, chest tightness and shortness of breath.   Cardiovascular: Negative for chest pain, palpitations and leg swelling.  Gastrointestinal: Negative for abdominal pain, diarrhea, nausea and vomiting.  Genitourinary: Negative for difficulty urinating and frequency.  Musculoskeletal: Negative for back pain and joint swelling.  Skin: Negative for color change and rash.  Neurological: Negative  for dizziness and headaches.  Hematological: Negative for adenopathy. Does not bruise/bleed easily.  Psychiatric/Behavioral: Negative for agitation and dysphoric mood.       Objective:    Physical Exam Vitals reviewed.  Constitutional:      General: He is not in acute distress.    Appearance: Normal appearance. He is well-developed.  HENT:     Head: Normocephalic and atraumatic.     Right Ear: External ear normal.     Left Ear: External ear normal.  Eyes:     General: No scleral icterus.       Right eye: No discharge.        Left eye: No discharge.     Conjunctiva/sclera: Conjunctivae normal.  Neck:     Thyroid: No thyromegaly.  Cardiovascular:     Rate and Rhythm: Normal rate and regular rhythm.  Pulmonary:     Effort: No respiratory distress.     Breath sounds: Normal breath sounds. No wheezing.  Abdominal:     General: Bowel sounds are normal.     Palpations: Abdomen is soft.     Tenderness: There is no abdominal tenderness.  Genitourinary:    Comments: Followed by Dr Yves Dill Musculoskeletal:        General: No tenderness.     Cervical back: Neck supple. No tenderness.  Lymphadenopathy:     Cervical: No cervical adenopathy.  Skin:    Findings: No erythema or rash.  Neurological:     Mental Status: He is alert and oriented to person, place, and time.  Psychiatric:        Mood and Affect: Mood normal.        Behavior: Behavior normal.     BP (!) 136/80   Pulse 56   Temp 98.6 F (37 C)   Resp 16   Ht _0  (1.727 m)   Wt 198 lb (89.8 kg)   SpO2 98%   BMI 30.11 kg/m  Wt Readings from Last 3 Encounters:  08/11/19 198 lb (89.8 kg)  03/29/19 203 lb (92.1 kg)  01/02/19 203 lb (92.1 kg)     Lab Results  Component Value Date   WBC 6.8 10/23/2018   HGB 14.4 10/23/2018   HCT 41.8 10/23/2018   PLT 201.0 10/23/2018   GLUCOSE 100 (H) 10/23/2018   CHOL 116 10/23/2018   TRIG 87.0 10/23/2018   HDL 41.70 10/23/2018   LDLCALC 57 10/23/2018   ALT 23  10/23/2018   AST 18 10/23/2018   NA 139 10/23/2018   K 4.1 10/23/2018   CL 102 10/23/2018   CREATININE 0.97 10/23/2018   BUN 14 10/23/2018   CO2 29 10/23/2018   TSH 4.00 10/23/2018   INR 1.0 12/19/2012   HGBA1C 5.9 10/23/2018       Assessment & Plan:  Problem List Items Addressed This Visit    Health care maintenance    Physical today 08/11/19.  psa and prostate checks through urology.  Just check.  States stable.  Colonoscopy 01/2012.        History of prostate cancer    Followed by urology.  Evaluated last week.  Stable.  Recommended f/u in 6 months.        Hypercholesterolemia    On crestor.  Low cholesterol diet and exercise.  Follow lipid panel and liver function tests.        Hyperglycemia    Low carb diet and exercise.  Follow met b and a1c.           Einar Pheasant, MD

## 2019-08-16 ENCOUNTER — Encounter: Payer: Self-pay | Admitting: Internal Medicine

## 2019-08-16 NOTE — Assessment & Plan Note (Signed)
On crestor.  Low cholesterol diet and exercise.  Follow lipid panel and liver function tests.   

## 2019-08-16 NOTE — Assessment & Plan Note (Signed)
Low carb diet and exercise.  Follow met b and a1c.  

## 2019-08-16 NOTE — Assessment & Plan Note (Signed)
Physical today 08/11/19.  psa and prostate checks through urology.  Just check.  States stable.  Colonoscopy 01/2012.

## 2019-08-16 NOTE — Assessment & Plan Note (Signed)
Followed by urology.  Evaluated last week.  Stable.  Recommended f/u in 6 months.

## 2019-08-18 ENCOUNTER — Other Ambulatory Visit (INDEPENDENT_AMBULATORY_CARE_PROVIDER_SITE_OTHER): Payer: BC Managed Care – PPO

## 2019-08-18 ENCOUNTER — Other Ambulatory Visit: Payer: Self-pay

## 2019-08-18 DIAGNOSIS — R739 Hyperglycemia, unspecified: Secondary | ICD-10-CM | POA: Diagnosis not present

## 2019-08-18 DIAGNOSIS — E7800 Pure hypercholesterolemia, unspecified: Secondary | ICD-10-CM

## 2019-08-18 LAB — BASIC METABOLIC PANEL
BUN: 15 mg/dL (ref 6–23)
CO2: 32 mEq/L (ref 19–32)
Calcium: 9.2 mg/dL (ref 8.4–10.5)
Chloride: 103 mEq/L (ref 96–112)
Creatinine, Ser: 0.85 mg/dL (ref 0.40–1.50)
GFR: 91.16 mL/min (ref >60.00)
Glucose, Bld: 114 mg/dL — ABNORMAL HIGH (ref 70–99)
Potassium: 4.2 mEq/L (ref 3.5–5.1)
Sodium: 138 mEq/L (ref 135–145)

## 2019-08-18 LAB — HEPATIC FUNCTION PANEL
ALT: 22 U/L (ref 0–53)
AST: 19 U/L (ref 0–37)
Albumin: 4.2 g/dL (ref 3.5–5.2)
Alkaline Phosphatase: 60 U/L (ref 39–117)
Bilirubin, Direct: 0.1 mg/dL (ref 0.0–0.3)
Total Bilirubin: 0.6 mg/dL (ref 0.2–1.2)
Total Protein: 6.6 g/dL (ref 6.0–8.3)

## 2019-08-18 LAB — LIPID PANEL
Cholesterol: 102 mg/dL (ref 0–200)
HDL: 42.1 mg/dL (ref >39.00)
LDL Cholesterol: 48 mg/dL (ref 0–99)
NonHDL: 59.67
Total CHOL/HDL Ratio: 2
Triglycerides: 58 mg/dL (ref 0.0–149.0)
VLDL: 11.6 mg/dL (ref 0.0–40.0)

## 2019-08-18 LAB — HEMOGLOBIN A1C: Hgb A1c MFr Bld: 5.8 % (ref 4.6–6.5)

## 2019-09-14 DIAGNOSIS — H401133 Primary open-angle glaucoma, bilateral, severe stage: Secondary | ICD-10-CM | POA: Diagnosis not present

## 2019-09-29 DIAGNOSIS — H401133 Primary open-angle glaucoma, bilateral, severe stage: Secondary | ICD-10-CM | POA: Diagnosis not present

## 2019-12-02 ENCOUNTER — Other Ambulatory Visit: Payer: Self-pay | Admitting: Internal Medicine

## 2020-01-28 IMAGING — CR DG CHEST 2V
1 series · 2 of 2 positions shown · non-contrast
Comparison: December 19, 2012

CLINICAL DATA: Chest pain.  History of prostate carcinoma

EXAM:
CHEST  2 VIEW

[Series 1: dg chest 2 view · 0.14mm/px · 2 of 2 slices shown]
[im 1/2]
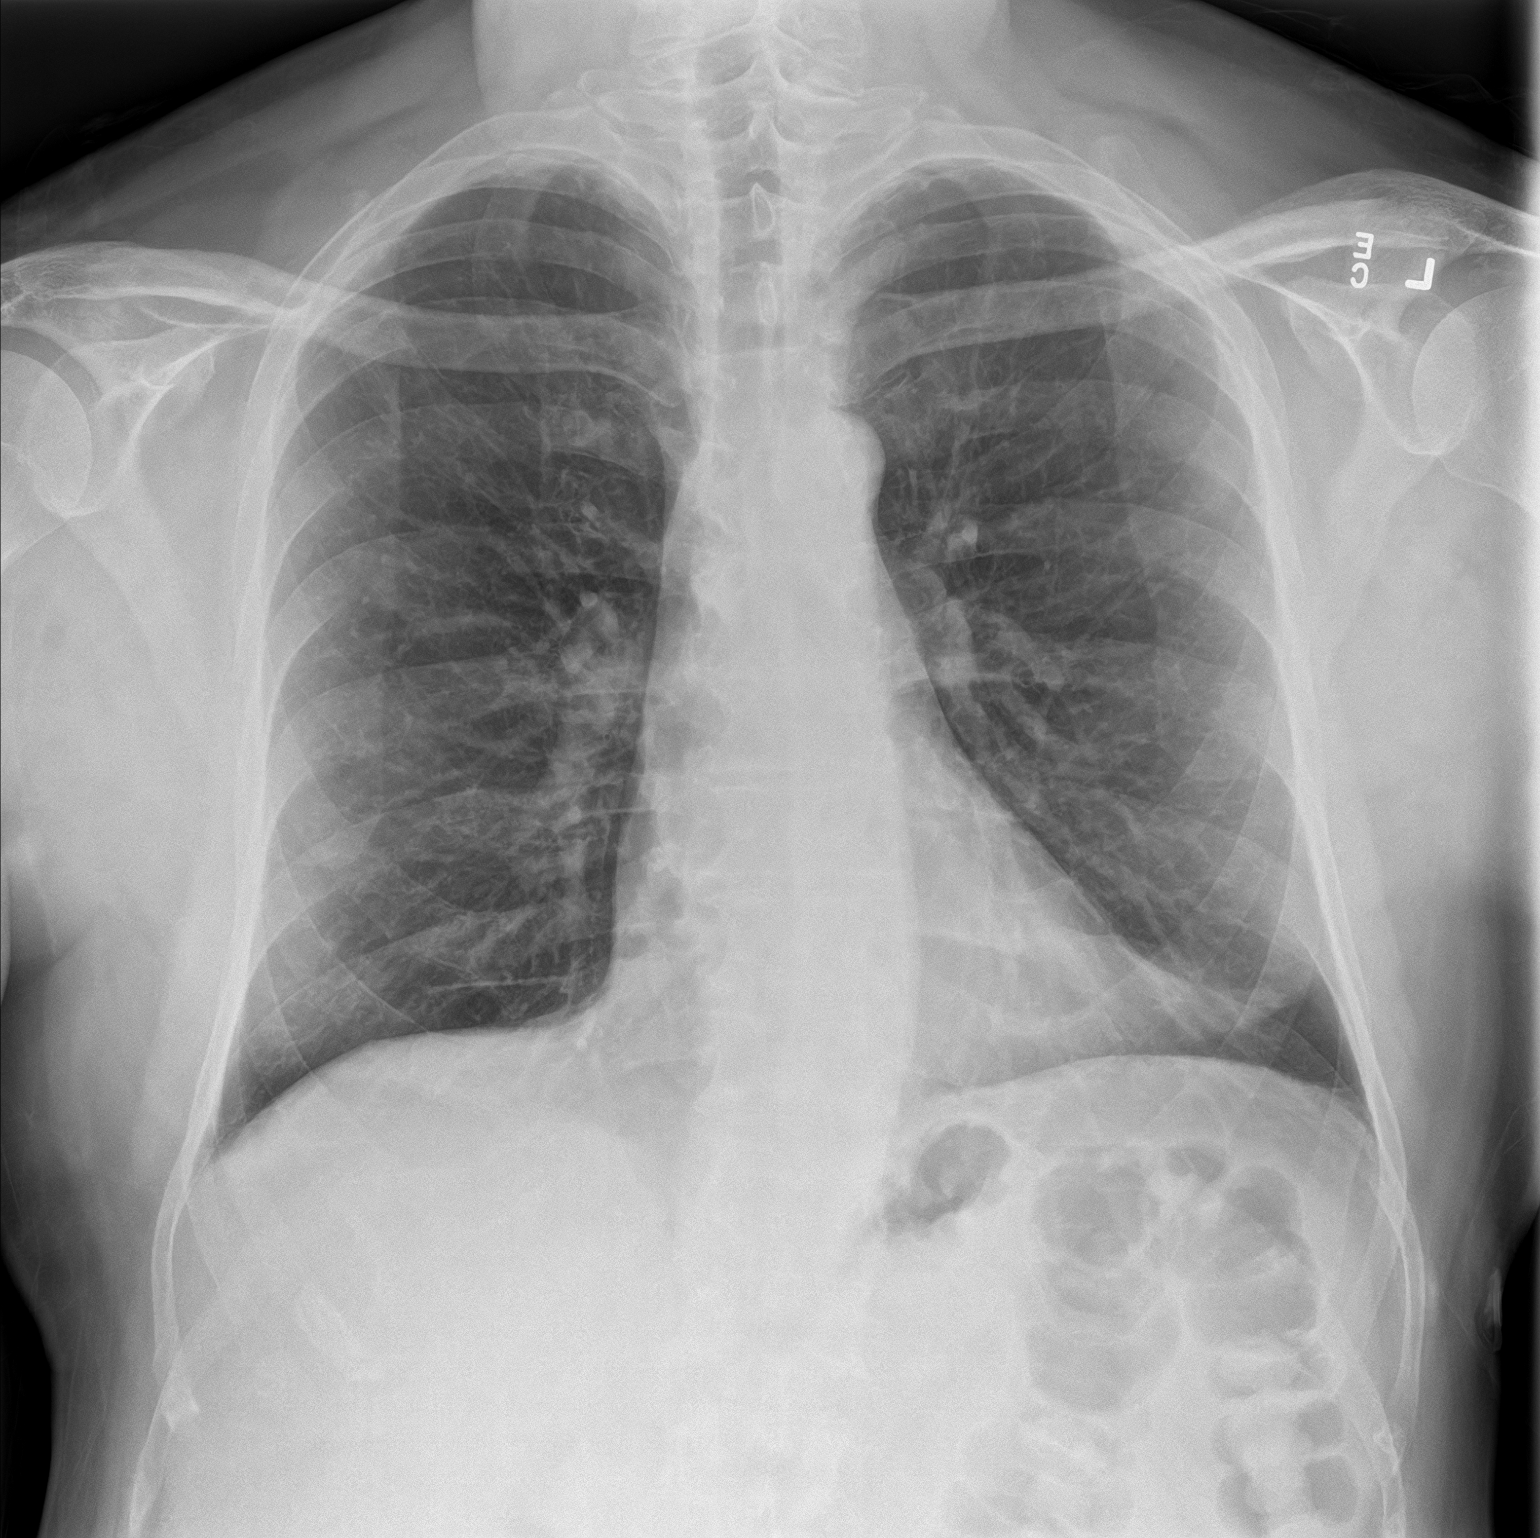
[im 2/2]
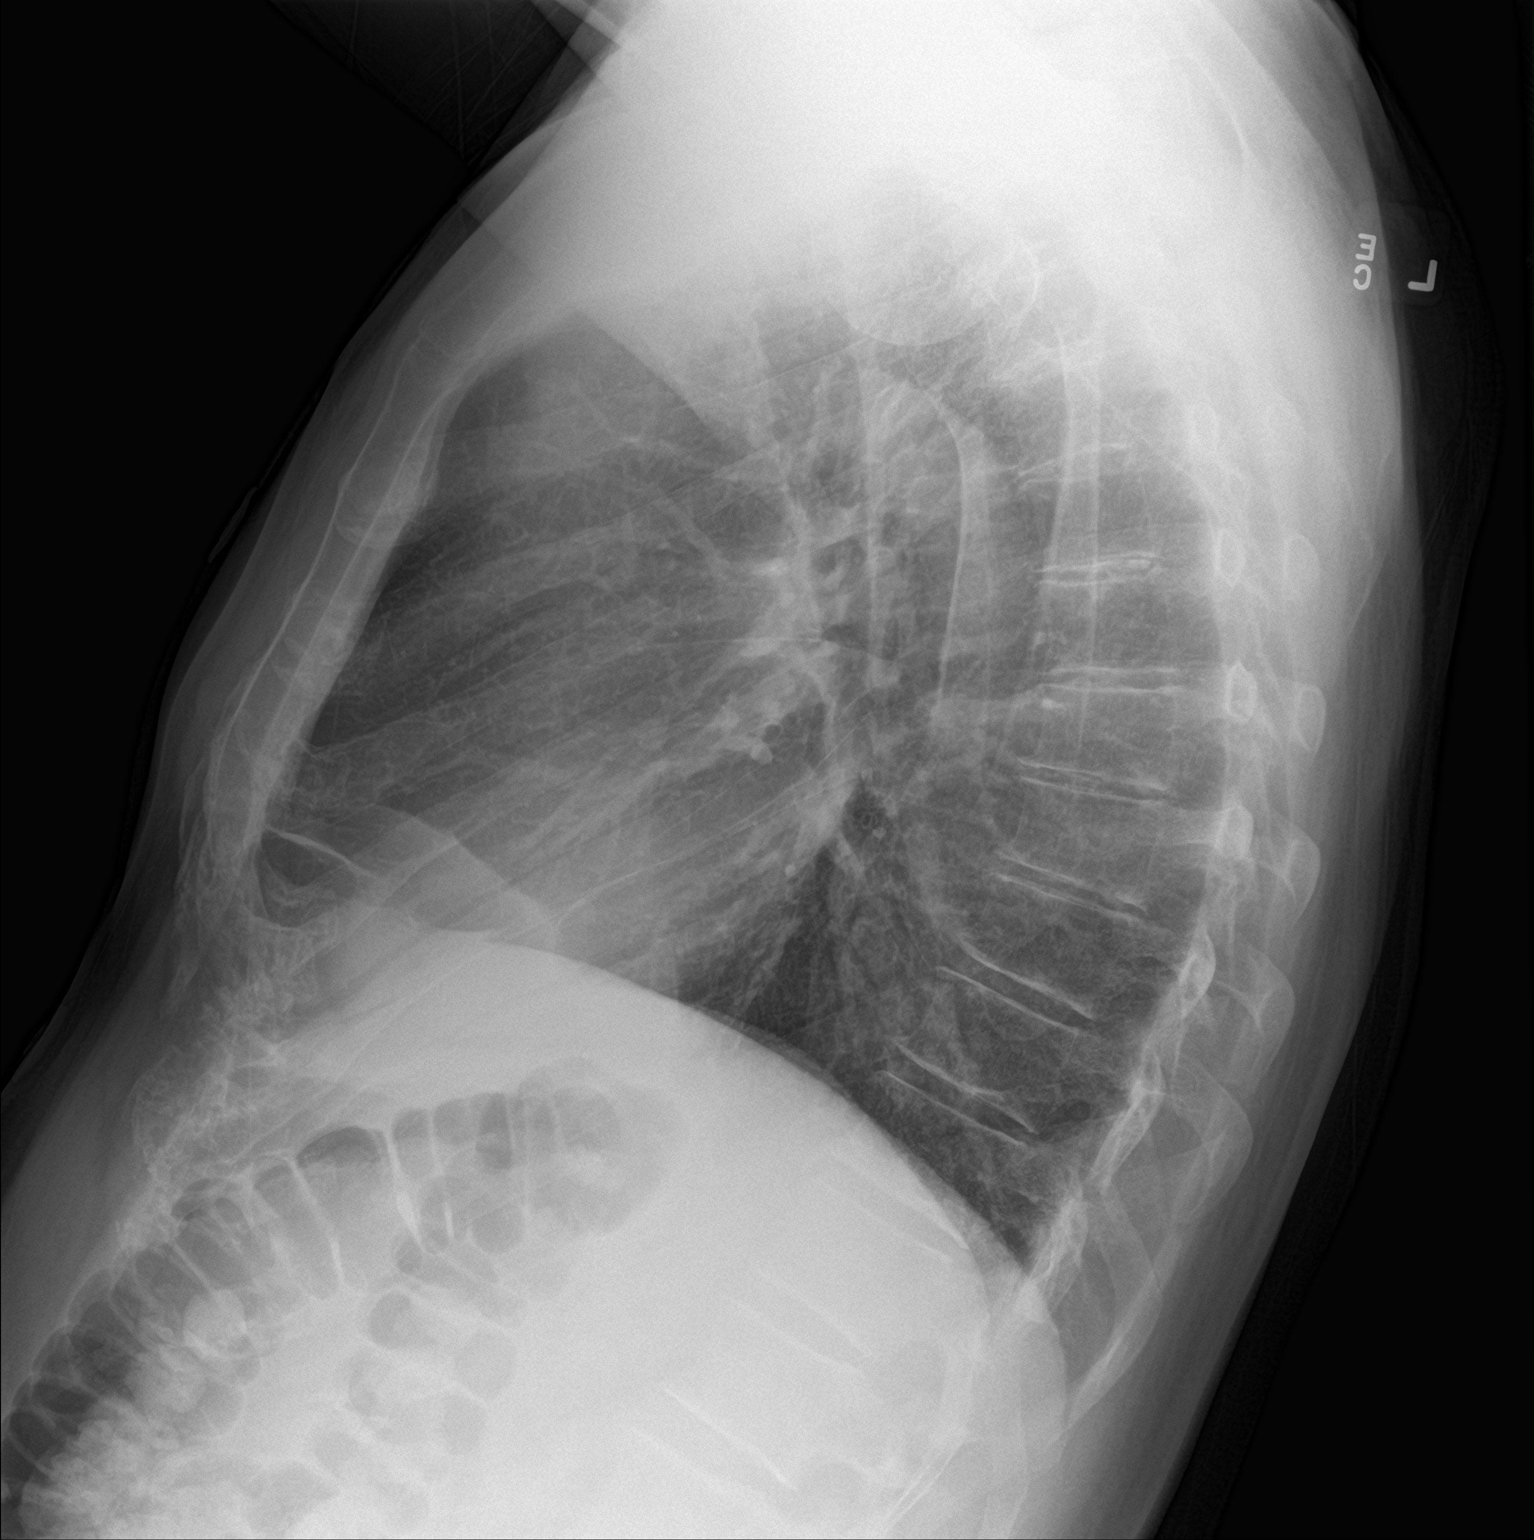

[2 of 2 positions shown; findings below may reference images not displayed]

FINDINGS: There is a 9 mm nodular opacity overlying the heart on the lateral
view. This nodular opacity is not appreciable on the frontal view.

There is either scarring or atelectatic change in the left base.
There is mild scarring in the right base. Lungs elsewhere clear.
Heart size and pulmonary vascularity are normal. No adenopathy. No
bone lesions.
IMPRESSION: 9 mm nodular opacity overlying the heart on lateral view. Advise
noncontrast enhanced chest CT to further assess.

Scarring versus atelectasis left base. Mild scarring right base.
Lungs elsewhere clear. Heart size normal.

## 2020-02-01 IMAGING — CT CT CHEST W/O CM
2 of 4 series · 15 of 36 positions shown, 18 images · non-contrast
Comparison: Chest x-ray dated February 23, 2017.

CLINICAL DATA: Possible lung nodule seen on recent chest x-ray.

EXAM:
CT CHEST WITHOUT CONTRAST
TECHNIQUE: Multidetector CT imaging of the chest was performed following the
standard protocol without IV contrast.

[Series 2: chest 2.00 br40 s3 · axial · 0.74mm/px · z∈[-1453,-1125]mm · 12 of 194 slices shown, 15 images]
[im 15/194  mediastinal]
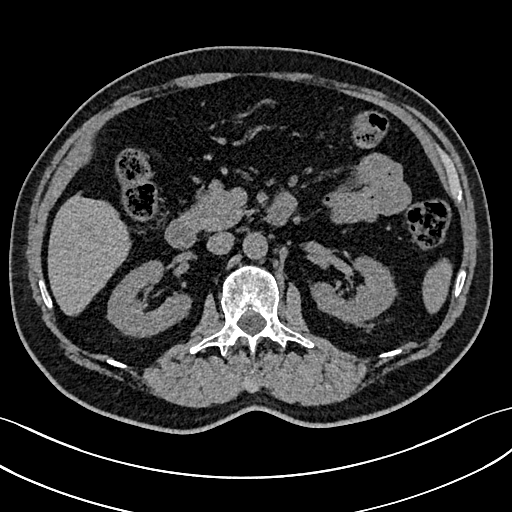
[im 15/194  lung]
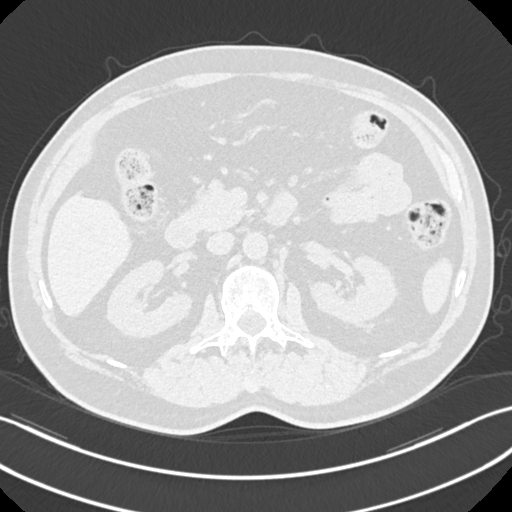
[im 30/194  lung]
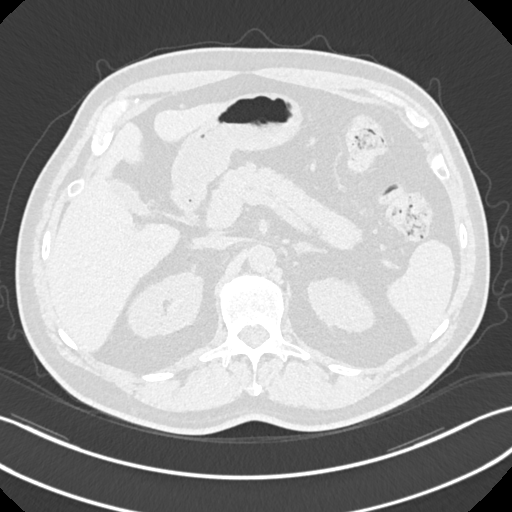
[im 45/194  lung]
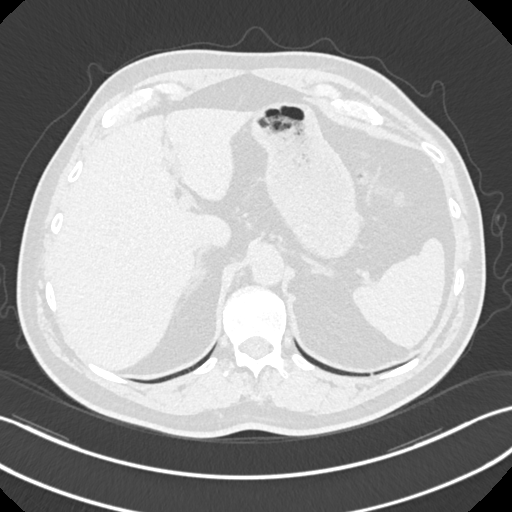
[im 60/194  lung]
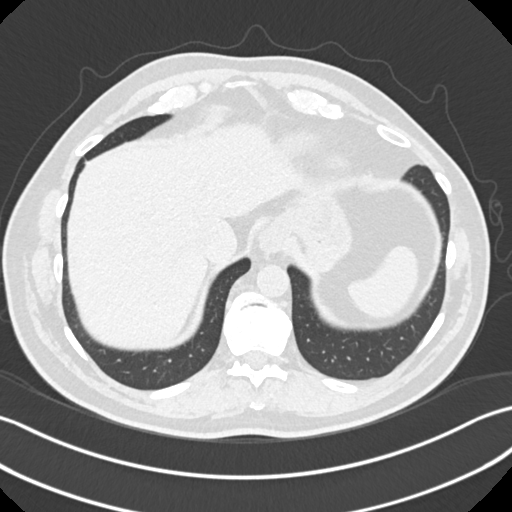
[im 75/194  mediastinal]
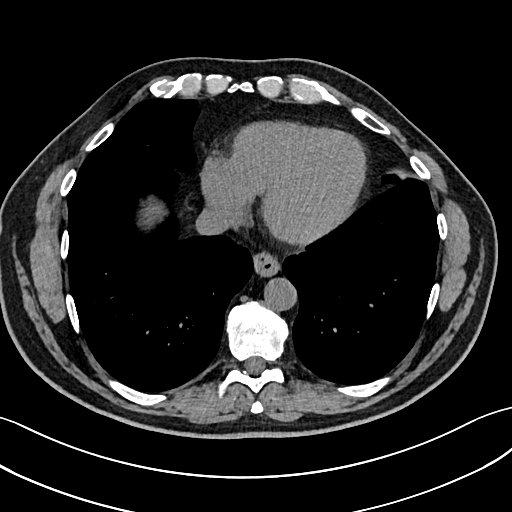
[im 75/194  lung]
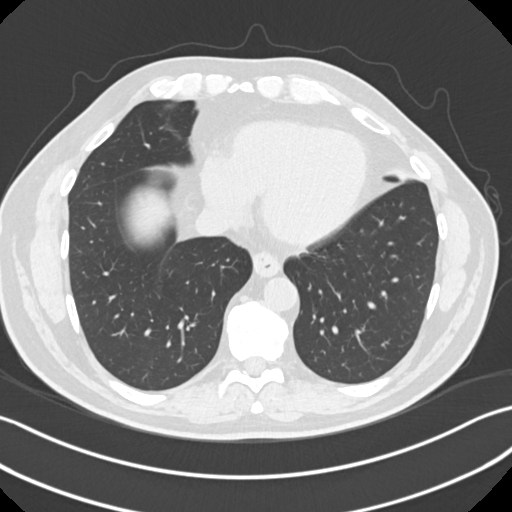
[im 90/194  lung]
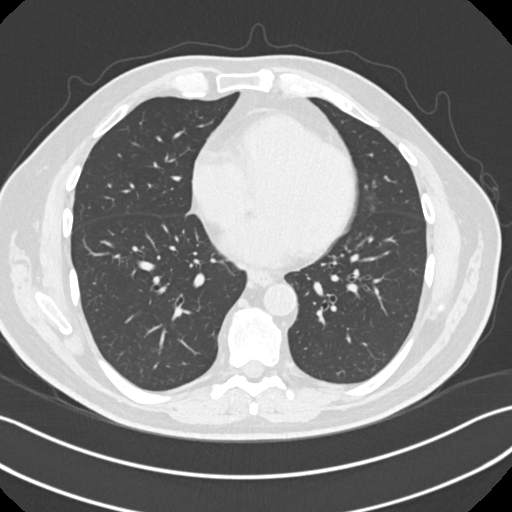
[im 104/194  lung]
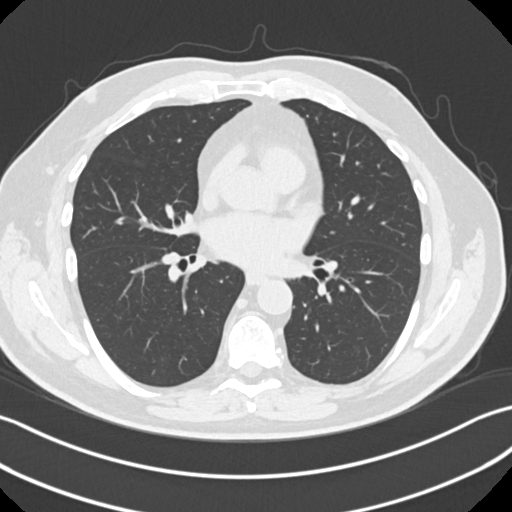
[im 119/194  lung]
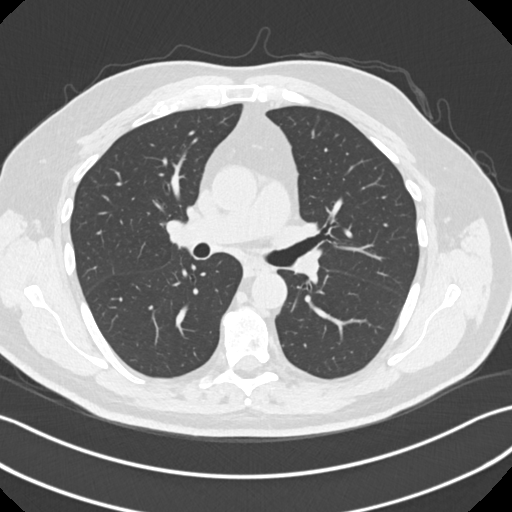
[im 134/194  mediastinal]
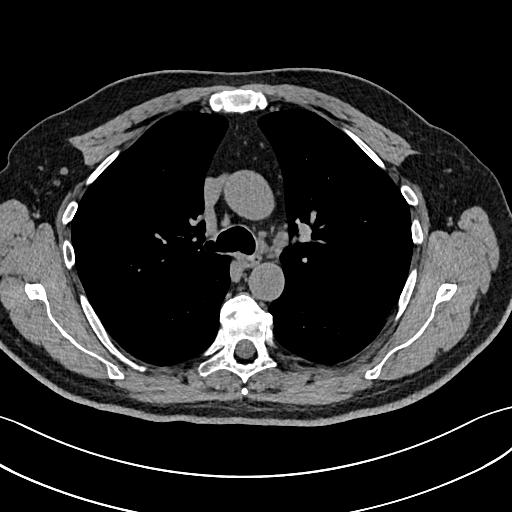
[im 134/194  lung]
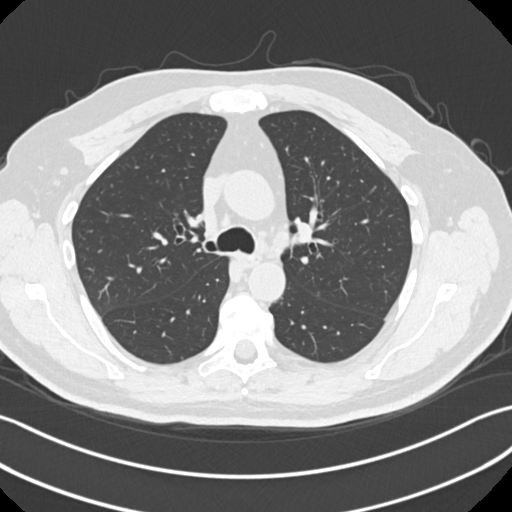
[im 149/194  lung]
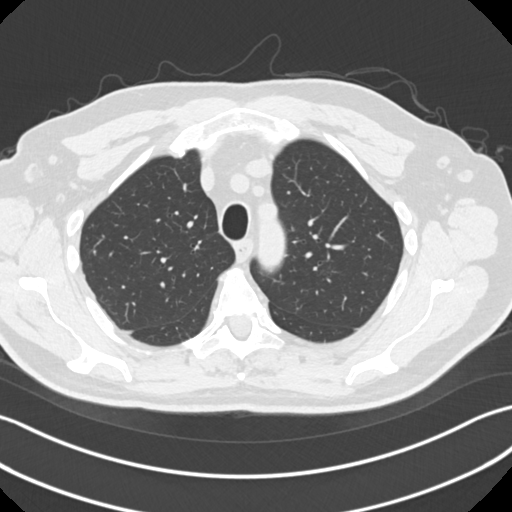
[im 164/194  lung]
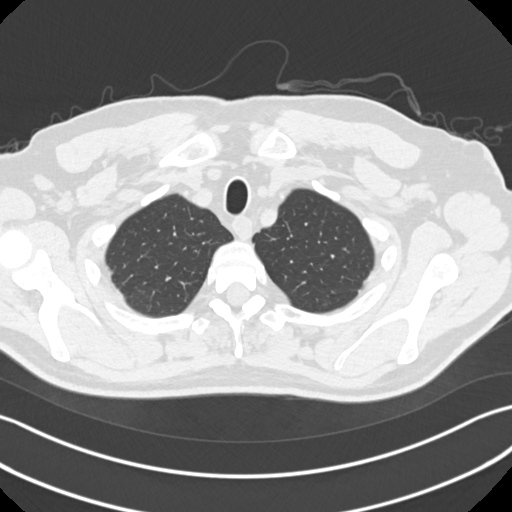
[im 179/194  lung]
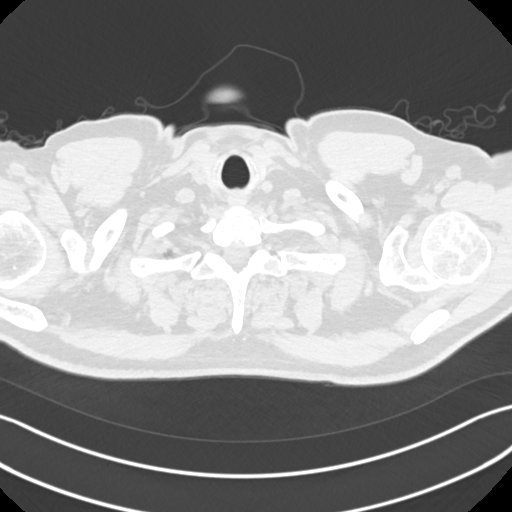

[Series 5: chest 2.00 br40 s3 cor · coronal · 0.74mm/px · 3 of 147 slices shown]
[im 30/147  lung]
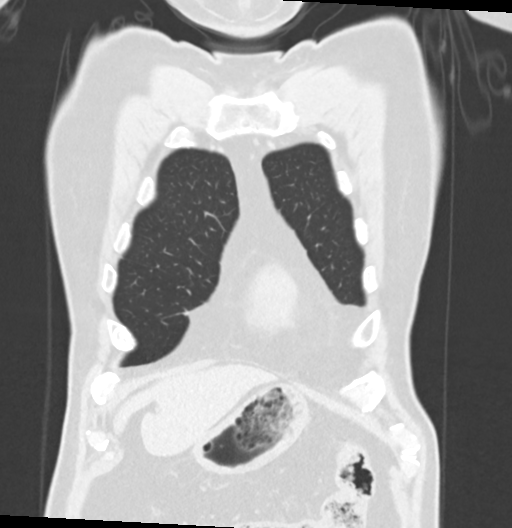
[im 59/147  lung]
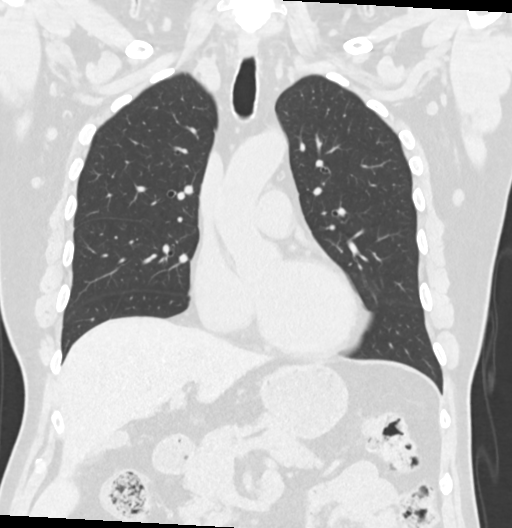
[im 88/147  lung]
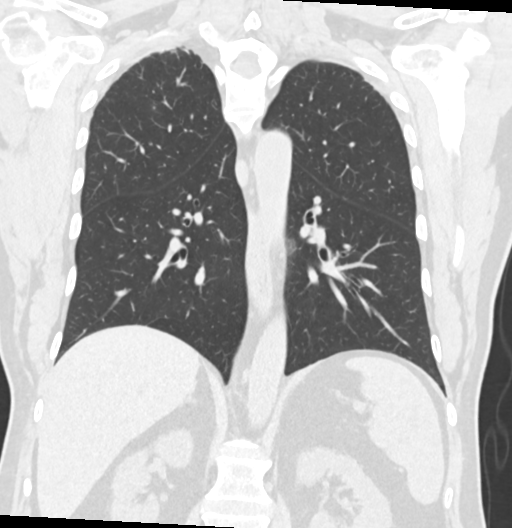

[15 of 36 positions shown; findings below may reference images not displayed]

FINDINGS: Cardiovascular: No significant vascular findings. Normal heart size.
No pericardial effusion. Normal caliber thoracic aorta.

Mediastinum/Nodes: No enlarged mediastinal or axillary lymph nodes.
Thyroid gland, trachea, and esophagus demonstrate no significant
findings.

Lungs/Pleura: Lungs are clear. No focal consolidation, pleural
effusion, or pneumothorax. There are a few scattered sub 2 mm
pulmonary nodules, benign. No suspicious pulmonary nodule.

Upper Abdomen: No acute abnormality.

Musculoskeletal: No acute or suspicious osseous finding.
Degenerative changes of the thoracic spine.
IMPRESSION: 1. No CT correlate for the nodular density seen on chest x-ray. No
suspicious pulmonary nodule.

## 2020-02-08 DIAGNOSIS — C61 Malignant neoplasm of prostate: Secondary | ICD-10-CM | POA: Diagnosis not present

## 2020-02-08 DIAGNOSIS — D400 Neoplasm of uncertain behavior of prostate: Secondary | ICD-10-CM | POA: Diagnosis not present

## 2020-02-12 ENCOUNTER — Ambulatory Visit: Payer: BC Managed Care – PPO | Admitting: Internal Medicine

## 2020-02-15 ENCOUNTER — Encounter: Payer: Self-pay | Admitting: Internal Medicine

## 2020-02-15 ENCOUNTER — Ambulatory Visit (INDEPENDENT_AMBULATORY_CARE_PROVIDER_SITE_OTHER): Payer: BC Managed Care – PPO | Admitting: Internal Medicine

## 2020-02-15 ENCOUNTER — Other Ambulatory Visit: Payer: Self-pay

## 2020-02-15 DIAGNOSIS — Z8546 Personal history of malignant neoplasm of prostate: Secondary | ICD-10-CM

## 2020-02-15 DIAGNOSIS — R739 Hyperglycemia, unspecified: Secondary | ICD-10-CM

## 2020-02-15 DIAGNOSIS — Z1211 Encounter for screening for malignant neoplasm of colon: Secondary | ICD-10-CM | POA: Diagnosis not present

## 2020-02-15 DIAGNOSIS — E7800 Pure hypercholesterolemia, unspecified: Secondary | ICD-10-CM

## 2020-02-15 NOTE — Progress Notes (Signed)
[name]., male   DOB: 04-Oct-1955, 65 y.o.   MRN: [id number]   Subjective:    [name]., male    DOB: 09-12-1955, 65 y.o.   MRN: [id number]  HPI This visit occurred during the SARS-CoV-2 public health emergency.  Safety protocols were in place, including screening questions prior to the visit, additional usage of staff PPE, and extensive cleaning of exam room while observing appropriate contact time as indicated for disinfecting solutions.  Patient here for a scheduled follow up. Here to follow up regarding his cholesterol.  Has a history of prostate cancer.  Sees Dr Yves Dill.  Recent psa per his report 1.5.  Stays active. Working.  No chest pain or sob reported.  No cough or congestion reported.  No abdominal pain or bowel change reported.     Past Medical History:  Diagnosis Date  . Allergy   . Glaucoma   . Hyperlipidemia   . Prostate CA [facility])    Past Surgical History:  Procedure Laterality Date  . BACK SURGERY  03/02/04  . GLAUCOMA SURGERY Right 2012   Family History  Problem Relation Age of Onset  . Hyperlipidemia Mother   . Prostate cancer Father   . Bone cancer Father    Social History   Socioeconomic History  . Marital status: Married    Spouse name: Not on file  . Number of children: Not on file  . Years of education: Not on file  . Highest education level: Not on file  Occupational History  . Not on file  Tobacco Use  . Smoking status: Never Smoker  . Smokeless tobacco: Never Used  Vaping Use  . Vaping Use: Never used  Substance and Sexual Activity  . Alcohol use: No    Alcohol/week: 0.0 standard drinks  . Drug use: No  . Sexual activity: Not on file  Other Topics Concern  . Not on file  Social History Narrative   Married with kids   Social Determinants of Health   Financial Resource Strain: Not on file  Food Insecurity: Not on file  Transportation Needs: Not on file  Physical Activity: Not on file   Stress: Not on file  Social Connections: Not on file    Outpatient Encounter Medications as of 02/15/2020  Medication Sig  . dorzolamide-timolol (COSOPT) 22.3-6.8 MG/ML ophthalmic solution   . fexofenadine (ALLEGRA) 180 MG tablet Take 180 mg by mouth daily.  . fluticasone (FLONASE) 50 MCG/ACT nasal spray Place 2 sprays into both nostrils daily. In each nostril  . LATANOPROST OP Apply 1 drop to eye at bedtime. Both eyes  . rosuvastatin (CRESTOR) 10 MG tablet TAKE 1 TABLET DAILY   No facility-administered encounter medications on file as of 02/15/2020.    Review of Systems  Constitutional: Negative for appetite change and unexpected weight change.  HENT: Negative for congestion and sinus pressure.   Respiratory: Negative for cough, chest tightness and shortness of breath.   Cardiovascular: Negative for chest pain, palpitations and leg swelling.  Gastrointestinal: Negative for abdominal pain, diarrhea, nausea and vomiting.  Genitourinary: Negative for difficulty urinating and dysuria.  Musculoskeletal: Negative for joint swelling and myalgias.  Skin: Negative for color change and rash.  Neurological: Negative for dizziness, light-headedness and headaches.  Psychiatric/Behavioral: Negative for agitation and dysphoric mood.       Objective:    Physical Exam Vitals reviewed.  Constitutional:      General: He is not in acute distress.  Appearance: Normal appearance. He is well-developed and well-nourished.  HENT:     Head: Normocephalic and atraumatic.     Right Ear: External ear normal.     Left Ear: External ear normal.  Eyes:     General: No scleral icterus.       Right eye: No discharge.        Left eye: No discharge.     Conjunctiva/sclera: Conjunctivae normal.  Cardiovascular:     Rate and Rhythm: Normal rate and regular rhythm.  Pulmonary:     Effort: Pulmonary effort is normal. No respiratory distress.     Breath sounds: Normal breath sounds.  Abdominal:      General: Bowel sounds are normal.     Palpations: Abdomen is soft.     Tenderness: There is no abdominal tenderness.  Musculoskeletal:        General: No swelling, tenderness or edema.     Cervical back: Neck supple. No tenderness.  Lymphadenopathy:     Cervical: No cervical adenopathy.  Skin:    Findings: No erythema or rash.  Neurological:     Mental Status: He is alert.  Psychiatric:        Mood and Affect: Mood and affect and mood normal.        Behavior: Behavior normal.     BP 130/70   Pulse 65   Temp 98.2 F (36.8 C) (Oral)   Resp 16   Ht '5\' 8"'  (1.727 m)   Wt 198 lb 3.2 oz (89.9 kg)   SpO2 98%   BMI 30.14 kg/m  Wt Readings from Last 3 Encounters:  02/15/20 198 lb 3.2 oz (89.9 kg)  08/11/19 198 lb (89.8 kg)  03/29/19 203 lb (92.1 kg)     Lab Results  Component Value Date   WBC 6.8 10/23/2018   HGB 14.4 10/23/2018   HCT 41.8 10/23/2018   PLT 201.0 10/23/2018   GLUCOSE 114 (H) 08/18/2019   CHOL 102 08/18/2019   TRIG 58.0 08/18/2019   HDL 42.10 08/18/2019   LDLCALC 48 08/18/2019   ALT 22 08/18/2019   AST 19 08/18/2019   NA 138 08/18/2019   K 4.2 08/18/2019   CL 103 08/18/2019   CREATININE 0.85 08/18/2019   BUN 15 08/18/2019   CO2 32 08/18/2019   TSH 4.00 10/23/2018   INR 1.0 12/19/2012   HGBA1C 5.8 08/18/2019       Assessment & Plan:   Problem List Items Addressed This Visit    Colon cancer screening    Per review colonoscopy 2014.  Need copy of results.  States had - [place].  Need to review to determine when due f/u.       History of prostate cancer    Followed by urology.  Stable.  Follow.       Hypercholesterolemia    On crestor.  Low cholesterol diet and exercise.  Follow lipid panel and liver function tests.        Relevant Orders   CBC with Differential/Platelet   Hepatic function panel   Lipid panel   TSH   Basic metabolic panel   Hyperglycemia    Low carb diet and exercise.  Follow met b and a1c.       Relevant Orders    Hemoglobin A1c       Einar Pheasant, MD

## 2020-02-21 ENCOUNTER — Encounter: Payer: Self-pay | Admitting: Internal Medicine

## 2020-02-21 ENCOUNTER — Telehealth: Payer: Self-pay | Admitting: Internal Medicine

## 2020-02-21 DIAGNOSIS — Z1211 Encounter for screening for malignant neoplasm of colon: Secondary | ICD-10-CM | POA: Insufficient documentation

## 2020-02-21 NOTE — Assessment & Plan Note (Signed)
Low carb diet and exercise.  Follow met b and a1c.  

## 2020-02-21 NOTE — Assessment & Plan Note (Signed)
Followed by urology.  Stable.  Follow.

## 2020-02-21 NOTE — Telephone Encounter (Signed)
Had colonoscopy around 2014. Thinks was done at outpatient facility [facility].  Was going to ask wife which GI MD saw.  Need copy of colonoscopy - to be able to determine when due f/u colonoscopy.

## 2020-02-21 NOTE — Assessment & Plan Note (Signed)
On crestor.  Low cholesterol diet and exercise.  Follow lipid panel and liver function tests.   

## 2020-02-21 NOTE — Assessment & Plan Note (Signed)
Per review colonoscopy 2014.  Need copy of results.  States had - [place].  Need to review to determine when due f/u.

## 2020-03-08 ENCOUNTER — Other Ambulatory Visit: Payer: Self-pay

## 2020-03-08 ENCOUNTER — Other Ambulatory Visit (INDEPENDENT_AMBULATORY_CARE_PROVIDER_SITE_OTHER): Payer: BC Managed Care – PPO

## 2020-03-08 DIAGNOSIS — R739 Hyperglycemia, unspecified: Secondary | ICD-10-CM

## 2020-03-08 DIAGNOSIS — E7800 Pure hypercholesterolemia, unspecified: Secondary | ICD-10-CM

## 2020-03-08 LAB — HEPATIC FUNCTION PANEL
ALT: 32 U/L (ref 0–53)
AST: 24 U/L (ref 0–37)
Albumin: 4.1 g/dL (ref 3.5–5.2)
Alkaline Phosphatase: 58 U/L (ref 39–117)
Bilirubin, Direct: 0.1 mg/dL (ref 0.0–0.3)
Total Bilirubin: 0.6 mg/dL (ref 0.2–1.2)
Total Protein: 6.9 g/dL (ref 6.0–8.3)

## 2020-03-08 LAB — CBC WITH DIFFERENTIAL/PLATELET
Basophils Absolute: 0.1 10*3/uL (ref 0.0–0.1)
Basophils Relative: 1 % (ref 0.0–3.0)
Eosinophils Absolute: 0.4 10*3/uL (ref 0.0–0.7)
Eosinophils Relative: 5.9 % — ABNORMAL HIGH (ref 0.0–5.0)
HCT: 40.5 % (ref 39.0–52.0)
Hemoglobin: 14 g/dL (ref 13.0–17.0)
Lymphocytes Relative: 24 % (ref 12.0–46.0)
Lymphs Abs: 1.5 10*3/uL (ref 0.7–4.0)
MCHC: 34.6 g/dL (ref 30.0–36.0)
MCV: 93.4 fl (ref 78.0–100.0)
Monocytes Absolute: 0.4 10*3/uL (ref 0.1–1.0)
Monocytes Relative: 6.9 % (ref 3.0–12.0)
Neutro Abs: 3.9 10*3/uL (ref 1.4–7.7)
Neutrophils Relative %: 62.2 % (ref 43.0–77.0)
Platelets: 196 10*3/uL (ref 150.0–400.0)
RBC: 4.34 Mil/uL (ref 4.22–5.81)
RDW: 13 % (ref 11.5–15.5)
WBC: 6.3 10*3/uL (ref 4.0–10.5)

## 2020-03-08 LAB — BASIC METABOLIC PANEL
BUN: 15 mg/dL (ref 6–23)
CO2: 30 mEq/L (ref 19–32)
Calcium: 9.2 mg/dL (ref 8.4–10.5)
Chloride: 102 mEq/L (ref 96–112)
Creatinine, Ser: 0.91 mg/dL (ref 0.40–1.50)
GFR: 89.92 mL/min (ref >60.00)
Glucose, Bld: 127 mg/dL — ABNORMAL HIGH (ref 70–99)
Potassium: 4.3 mEq/L (ref 3.5–5.1)
Sodium: 139 mEq/L (ref 135–145)

## 2020-03-08 LAB — TSH: TSH: 2.54 u[IU]/mL (ref 0.35–4.50)

## 2020-03-08 LAB — LIPID PANEL
Cholesterol: 114 mg/dL (ref 0–200)
HDL: 47.4 mg/dL (ref >39.00)
LDL Cholesterol: 56 mg/dL (ref 0–99)
NonHDL: 66.46
Total CHOL/HDL Ratio: 2
Triglycerides: 54 mg/dL (ref 0.0–149.0)
VLDL: 10.8 mg/dL (ref 0.0–40.0)

## 2020-03-08 LAB — HEMOGLOBIN A1C: Hgb A1c MFr Bld: 5.8 % (ref 4.6–6.5)

## 2020-03-08 NOTE — Telephone Encounter (Signed)
Requested colonoscopy from Dr Candace Cruise

## 2020-03-31 DIAGNOSIS — H401133 Primary open-angle glaucoma, bilateral, severe stage: Secondary | ICD-10-CM | POA: Diagnosis not present

## 2020-06-01 DIAGNOSIS — D225 Melanocytic nevi of trunk: Secondary | ICD-10-CM | POA: Diagnosis not present

## 2020-06-01 DIAGNOSIS — D2271 Melanocytic nevi of right lower limb, including hip: Secondary | ICD-10-CM | POA: Diagnosis not present

## 2020-06-01 DIAGNOSIS — D2262 Melanocytic nevi of left upper limb, including shoulder: Secondary | ICD-10-CM | POA: Diagnosis not present

## 2020-06-01 DIAGNOSIS — L570 Actinic keratosis: Secondary | ICD-10-CM | POA: Diagnosis not present

## 2020-06-01 DIAGNOSIS — D2261 Melanocytic nevi of right upper limb, including shoulder: Secondary | ICD-10-CM | POA: Diagnosis not present

## 2020-08-08 DIAGNOSIS — C61 Malignant neoplasm of prostate: Secondary | ICD-10-CM | POA: Diagnosis not present

## 2020-08-08 DIAGNOSIS — D400 Neoplasm of uncertain behavior of prostate: Secondary | ICD-10-CM | POA: Diagnosis not present

## 2020-08-15 ENCOUNTER — Encounter: Payer: BC Managed Care – PPO | Admitting: Internal Medicine

## 2020-09-24 ENCOUNTER — Encounter: Payer: Self-pay | Admitting: Emergency Medicine

## 2020-09-24 ENCOUNTER — Other Ambulatory Visit: Payer: Self-pay

## 2020-09-24 ENCOUNTER — Ambulatory Visit
Admission: EM | Admit: 2020-09-24 | Discharge: 2020-09-24 | Disposition: A | Discharge status: Home / Self Care | Payer: BC Managed Care – PPO | Attending: Emergency Medicine | Admitting: Emergency Medicine

## 2020-09-24 DIAGNOSIS — M5442 Lumbago with sciatica, left side: Secondary | ICD-10-CM

## 2020-09-24 DIAGNOSIS — J069 Acute upper respiratory infection, unspecified: Secondary | ICD-10-CM

## 2020-09-24 DIAGNOSIS — H1033 Unspecified acute conjunctivitis, bilateral: Secondary | ICD-10-CM | POA: Diagnosis not present

## 2020-09-24 MED ORDER — POLYMYXIN B-TRIMETHOPRIM 10000-0.1 UNIT/ML-% OP SOLN: 1.0000 [drp] | Freq: Four times a day (QID) | OPHTHALMIC | 0 refills | Status: AC

## 2020-09-24 MED ORDER — METHOCARBAMOL 500 MG PO TABS: 500.0000 mg | ORAL_TABLET | Freq: Two times a day (BID) | ORAL | 0 refills | Status: DC | PRN

## 2020-09-24 NOTE — ED Triage Notes (Signed)
Pt here with nasal congestion and eye redness and drainage x 1 week. And left lumbar back pain since 1pm yesterday at work. No urinary sx.

## 2020-09-24 NOTE — Discharge Instructions (Addendum)
Take ibuprofen as needed for discomfort.  Take the muscle relaxer as needed for muscle spasm; Do not drive, operate machinery, or drink alcohol with this medication as it can cause drowsiness. Follow up with your primary care provider or an orthopedist if your symptoms are not improving.    Use the antibiotic eyedrops as prescribed.  Follow-up with your eye doctor for a recheck in 1 to 2 days if your symptoms are not improving.  Go to the emergency department if you have acute eye pain, changes in your vision, or other concerning symptoms.

## 2020-09-24 NOTE — ED Provider Notes (Signed)
Jeremy Fischer    CSN: [id number] Arrival date & time: 09/24/20  1048      History   Chief Complaint Chief Complaint  Patient presents with   Eye Problem   Nasal Congestion   Back Pain    HPI Jeremy Fischer. is a 65 y.o. male.  Patient presents with left lower back pain since yesterday afternoon.  The pain started while he was at work; he was lifting things.  The pain was radiating to his left posterior leg yesterday but not today.  The pain has improved today.  No OTC pain medication taken at home.  Patient also reports 1 week history of bilateral eye redness and yellow drainage.  No eye pain or changes in vision.  He reports nasal congestion earlier in the week which has improved.  He denies fever, chills, ear pain, sore throat, cough, shortness of breath, abdominal pain, dysuria, hematuria, numbness, weakness, paresthesias, saddle anesthesia, loss of bowel/bladder control, or other symptoms.  No treatments attempted at home.  His medical history includes seasonal allergies, prostate cancer, glaucoma, hyperlipidemia.   The history is provided by the patient and medical records.   Past Medical History:  Diagnosis Date   Allergy    Glaucoma    Hyperlipidemia    Prostate CA [facility])     Patient Active Problem List   Diagnosis Date Noted   Colon cancer screening 02/21/2020   Hypercholesterolemia 04/01/2018   Acute non-recurrent sinusitis 05/01/2017   History of prostate cancer 04/24/2015   Pre-op evaluation 04/24/2015   Health care maintenance 10/24/2014   Rash 10/24/2014   Elevated blood pressure reading 10/18/2013   Abnormal CT scan, neck 05/10/2013   Hyperglycemia 05/10/2013   BPH (benign prostatic hypertrophy) 10/22/2012   Glaucoma 10/15/2012   Environmental allergies 10/15/2012    Past Surgical History:  Procedure Laterality Date   BACK SURGERY  03/02/04   GLAUCOMA SURGERY Right 2012       Home Medications    Prior to Admission medications    Medication Sig Start Date End Date Taking? Authorizing Provider  methocarbamol (ROBAXIN) 500 MG tablet Take 1 tablet (500 mg total) by mouth 2 (two) times daily as needed for muscle spasms. 09/24/20  Yes Sharion Balloon, NP  trimethoprim-polymyxin b (POLYTRIM) ophthalmic solution Place 1 drop into both eyes 4 (four) times daily for 7 days. 09/24/20 10/01/20 Yes Sharion Balloon, NP  dorzolamide-timolol (COSOPT) 22.3-6.8 MG/ML ophthalmic solution  11/03/12   [provider]  fexofenadine (ALLEGRA) 180 MG tablet Take 180 mg by mouth daily.    [provider]  fluticasone (FLONASE) 50 MCG/ACT nasal spray Place 2 sprays into both nostrils daily. In each nostril 05/01/17   Venia Carbon, MD  LATANOPROST OP Apply 1 drop to eye at bedtime. Both eyes    [provider]  rosuvastatin (CRESTOR) 10 MG tablet TAKE 1 TABLET DAILY 12/02/19   Einar Pheasant, MD    Family History Family History  Problem Relation Age of Onset   Hyperlipidemia Mother    Prostate cancer Father    Bone cancer Father     Social History Social History   Tobacco Use   Smoking status: Never   Smokeless tobacco: Never  Vaping Use   Vaping Use: Never used  Substance Use Topics   Alcohol use: No    Alcohol/week: 0.0 standard drinks   Drug use: No     Allergies   Contrast media [iodinated diagnostic agents]  Review of Systems Review of Systems  Constitutional:  Negative for chills and fever.  HENT:  Positive for congestion and rhinorrhea. Negative for ear pain and sore throat.   Eyes:  Positive for discharge and redness. Negative for pain and visual disturbance.  Respiratory:  Negative for cough and shortness of breath.   Cardiovascular:  Negative for chest pain and palpitations.  Gastrointestinal:  Negative for abdominal pain and vomiting.  Genitourinary:  Negative for dysuria and hematuria.  Musculoskeletal:  Positive for back pain. Negative for arthralgias.  Skin:  Negative for color  change and rash.  Neurological:  Negative for weakness and numbness.  All other systems reviewed and are negative.   Physical Exam Triage Vital Signs ED Triage Vitals  Enc Vitals Group     BP      Pulse      Resp      Temp      Temp src      SpO2      Weight      Height      Head Circumference      Peak Flow      Pain Score      Pain Loc      Pain Edu?      Excl. in [place]?    No data found.  Updated Vital Signs BP (!) 141/73   Pulse 60   Temp 98.3 F (36.8 C) (Oral)   Resp 20   SpO2 98%   Visual Acuity Right Eye Distance:   Left Eye Distance:   Bilateral Distance:    Right Eye Near:   Left Eye Near:    Bilateral Near:     Physical Exam Vitals and nursing note reviewed.  Constitutional:      General: He is not in acute distress.    Appearance: He is well-developed. He is not ill-appearing.  HENT:     Head: Normocephalic and atraumatic.     Right Ear: Tympanic membrane normal.     Left Ear: Tympanic membrane normal.     Nose: Nose normal.     Mouth/Throat:     Mouth: Mucous membranes are moist.     Pharynx: Oropharynx is clear.  Eyes:     General: Lids are normal. Vision grossly intact.     Extraocular Movements: Extraocular movements intact.     Conjunctiva/sclera:     Right eye: Right conjunctiva is injected.     Left eye: Left conjunctiva is injected.     Pupils: Pupils are equal, round, and reactive to light.  Cardiovascular:     Rate and Rhythm: Normal rate and regular rhythm.     Heart sounds: Normal heart sounds.  Pulmonary:     Effort: Pulmonary effort is normal. No respiratory distress.     Breath sounds: Normal breath sounds.  Abdominal:     General: Bowel sounds are normal.     Palpations: Abdomen is soft.     Tenderness: There is no abdominal tenderness. There is no right CVA tenderness, left CVA tenderness, guarding or rebound.  Musculoskeletal:        General: No swelling, tenderness, deformity or signs of injury. Normal range of  motion.     Cervical back: Neck supple.  Skin:    General: Skin is warm and dry.     Findings: No bruising, erythema, lesion or rash.  Neurological:     General: No focal deficit present.     Mental Status: He is alert  and oriented to person, place, and time.     Sensory: No sensory deficit.     Motor: No weakness.     Gait: Gait normal.  Psychiatric:        Mood and Affect: Mood normal.        Behavior: Behavior normal.     UC Treatments / Results  Labs (all labs ordered are listed, but only abnormal results are displayed) Labs Reviewed - No data to display  EKG   Radiology No results found.  Procedures Procedures (including critical care time)  Medications Ordered in UC Medications - No data to display  Initial Impression / Assessment and Plan / UC Course  I have reviewed the triage vital signs and the nursing notes.  Pertinent labs & imaging results that were available during my care of the patient were reviewed by me and considered in my medical decision making (see chart for details).  Acute left lower back pain with left sciatica.  Bilateral conjunctivitis, viral URI.  Treating back pain with OTC ibuprofen and prescribed methocarbamol.  Precautions for drowsiness with muscle relaxer discussed.  Instructed patient to follow-up with his PCP if his symptoms or not improving.  Treating eye infection with Polytrim eyedrops.  Instructed patient to follow-up with his eye care provider in 1 to 2 days if his symptoms are not improving.  ED precautions discussed.  Patient agrees to plan of care.   Final Clinical Impressions(s) / UC Diagnoses   Final diagnoses:  Acute left-sided low back pain with left-sided sciatica  Acute conjunctivitis of both eyes, unspecified acute conjunctivitis type  Viral upper respiratory tract infection     Discharge Instructions      Take ibuprofen as needed for discomfort.  Take the muscle relaxer as needed for muscle spasm; Do not drive,  operate machinery, or drink alcohol with this medication as it can cause drowsiness. Follow up with your primary care provider or an orthopedist if your symptoms are not improving.    Use the antibiotic eyedrops as prescribed.  Follow-up with your eye doctor for a recheck in 1 to 2 days if your symptoms are not improving.  Go to the emergency department if you have acute eye pain, changes in your vision, or other concerning symptoms.        ED Prescriptions     Medication Sig Dispense Auth. Provider   methocarbamol (ROBAXIN) 500 MG tablet Take 1 tablet (500 mg total) by mouth 2 (two) times daily as needed for muscle spasms. 10 tablet Sharion Balloon, NP   trimethoprim-polymyxin b (POLYTRIM) ophthalmic solution Place 1 drop into both eyes 4 (four) times daily for 7 days. 10 mL Sharion Balloon, NP      I have reviewed the PDMP during this encounter.   Sharion Balloon, NP 09/24/20 1126

## 2020-09-30 DIAGNOSIS — H401133 Primary open-angle glaucoma, bilateral, severe stage: Secondary | ICD-10-CM | POA: Diagnosis not present

## 2020-10-07 DIAGNOSIS — H401133 Primary open-angle glaucoma, bilateral, severe stage: Secondary | ICD-10-CM | POA: Diagnosis not present

## 2020-10-14 ENCOUNTER — Encounter: Payer: BC Managed Care – PPO | Admitting: Internal Medicine

## 2020-11-21 ENCOUNTER — Encounter: Payer: Self-pay | Admitting: Internal Medicine

## 2020-11-21 ENCOUNTER — Other Ambulatory Visit: Payer: Self-pay

## 2020-11-21 ENCOUNTER — Ambulatory Visit (INDEPENDENT_AMBULATORY_CARE_PROVIDER_SITE_OTHER): Payer: BC Managed Care – PPO | Admitting: Internal Medicine

## 2020-11-21 VITALS — BP 130/72 | HR 62 | Temp 97.6°F | Resp 16 | Ht 68.0 in | Wt 196.0 lb

## 2020-11-21 DIAGNOSIS — R739 Hyperglycemia, unspecified: Secondary | ICD-10-CM

## 2020-11-21 DIAGNOSIS — Z8546 Personal history of malignant neoplasm of prostate: Secondary | ICD-10-CM

## 2020-11-21 DIAGNOSIS — E7800 Pure hypercholesterolemia, unspecified: Secondary | ICD-10-CM | POA: Diagnosis not present

## 2020-11-21 DIAGNOSIS — Z0000 Encounter for general adult medical examination without abnormal findings: Secondary | ICD-10-CM

## 2020-11-21 NOTE — Progress Notes (Signed)
Patient ID: Jeremy Harvest., male   DOB: 1955/03/16, 65 y.o.   MRN: [id number]   Subjective:    Patient ID: Jeremy Harvest., male    DOB: September 19, 1955, 65 y.o.   MRN: [id number]  This visit occurred during the SARS-CoV-2 public health emergency.  Safety protocols were in place, including screening questions prior to the visit, additional usage of staff PPE, and extensive cleaning of exam room while observing appropriate contact time as indicated for disinfecting solutions.   Patient here for his physical exam.   Chief Complaint  Patient presents with   Annual Exam   .   HPI He is doing well.  Feels good.  Working four days per week.  No chest pain or sob with increased activity or exertion.  No acid reflux or abdominal pain reported.  Bowels moving.  No back pain now.  Is followed by urology.  Had colonoscopy in [place].    Past Medical History:  Diagnosis Date   Allergy    Glaucoma    Hyperlipidemia    Prostate CA [facility]    Past Surgical History:  Procedure Laterality Date   BACK SURGERY  03/02/04   GLAUCOMA SURGERY Right 2012   Family History  Problem Relation Age of Onset   Hyperlipidemia Mother    Prostate cancer Father    Bone cancer Father    Social History   Socioeconomic History   Marital status: Married    Spouse name: Not on file   Number of children: Not on file   Years of education: Not on file   Highest education level: Not on file  Occupational History   Not on file  Tobacco Use   Smoking status: Never   Smokeless tobacco: Never  Vaping Use   Vaping Use: Never used  Substance and Sexual Activity   Alcohol use: No    Alcohol/week: 0.0 standard drinks   Drug use: No   Sexual activity: Not on file  Other Topics Concern   Not on file  Social History Narrative   Married with kids   Social Determinants of Health   Financial Resource Strain: Not on file  Food Insecurity: Not on file  Transportation Needs: Not on file  Physical  Activity: Not on file  Stress: Not on file  Social Connections: Not on file     Review of Systems  Constitutional:  Negative for appetite change and unexpected weight change.  HENT:  Negative for congestion, sinus pressure and sore throat.   Eyes:  Negative for pain and visual disturbance.  Respiratory:  Negative for cough, chest tightness and shortness of breath.   Cardiovascular:  Negative for chest pain and palpitations.  Gastrointestinal:  Negative for abdominal pain, diarrhea, nausea and vomiting.  Genitourinary:  Negative for difficulty urinating and dysuria.  Musculoskeletal:  Negative for joint swelling and myalgias.  Skin:  Negative for color change and rash.  Neurological:  Negative for dizziness, light-headedness and headaches.  Hematological:  Negative for adenopathy. Does not bruise/bleed easily.  Psychiatric/Behavioral:  Negative for agitation and dysphoric mood.       Objective:     BP 130/72   Pulse 62   Temp 97.6 F (36.4 C)   Resp 16   Ht '5\' 8"'  (1.727 m)   Wt 196 lb (88.9 kg)   SpO2 98%   BMI 29.80 kg/m  Wt Readings from Last 3 Encounters:  11/21/20 196 lb (88.9 kg)  02/15/20 198 lb 3.2 oz (89.9  kg)  08/11/19 198 lb (89.8 kg)    Physical Exam Constitutional:      General: He is not in acute distress.    Appearance: Normal appearance. He is well-developed.  HENT:     Head: Normocephalic and atraumatic.     Right Ear: External ear normal.     Left Ear: External ear normal.  Eyes:     General: No scleral icterus.       Right eye: No discharge.        Left eye: No discharge.     Conjunctiva/sclera: Conjunctivae normal.  Neck:     Thyroid: No thyromegaly.  Cardiovascular:     Rate and Rhythm: Normal rate and regular rhythm.  Pulmonary:     Effort: No respiratory distress.     Breath sounds: Normal breath sounds. No wheezing.  Abdominal:     General: Bowel sounds are normal.     Palpations: Abdomen is soft.     Tenderness: There is no  abdominal tenderness.  Musculoskeletal:        General: No swelling or tenderness.     Cervical back: Neck supple. No tenderness.  Lymphadenopathy:     Cervical: No cervical adenopathy.  Skin:    Findings: No erythema or rash.  Neurological:     Mental Status: He is alert and oriented to person, place, and time.  Psychiatric:        Mood and Affect: Mood normal.        Behavior: Behavior normal.     Outpatient Encounter Medications as of 11/21/2020  Medication Sig   dorzolamide-timolol (COSOPT) 22.3-6.8 MG/ML ophthalmic solution    fexofenadine (ALLEGRA) 180 MG tablet Take 180 mg by mouth daily.   fluticasone (FLONASE) 50 MCG/ACT nasal spray Place 2 sprays into both nostrils daily. In each nostril   LATANOPROST OP Apply 1 drop to eye at bedtime. Both eyes   methocarbamol (ROBAXIN) 500 MG tablet Take 1 tablet (500 mg total) by mouth 2 (two) times daily as needed for muscle spasms.   rosuvastatin (CRESTOR) 10 MG tablet TAKE 1 TABLET DAILY   No facility-administered encounter medications on file as of 11/21/2020.     Lab Results  Component Value Date   WBC 6.3 03/08/2020   HGB 14.0 03/08/2020   HCT 40.5 03/08/2020   PLT 196.0 03/08/2020   GLUCOSE 127 (H) 03/08/2020   CHOL 114 03/08/2020   TRIG 54.0 03/08/2020   HDL 47.40 03/08/2020   LDLCALC 56 03/08/2020   ALT 32 03/08/2020   AST 24 03/08/2020   NA 139 03/08/2020   K 4.3 03/08/2020   CL 102 03/08/2020   CREATININE 0.91 03/08/2020   BUN 15 03/08/2020   CO2 30 03/08/2020   TSH 2.54 03/08/2020   INR 1.0 12/19/2012   HGBA1C 5.8 03/08/2020       Assessment & Plan:   Problem List Items Addressed This Visit     Health care maintenance    Physical today 11/28/20.  Urology following - history of prostate cancer.  Colonoscopy 01/2012.  States has in [place].  Need to see if can determine f/u recs.       History of prostate cancer    Followed by urology.  Stable.      Hypercholesterolemia    On crestor.  Low  cholesterol diet and exercise.  Follow lipid panel and liver function tests.        Relevant Orders   CBC with Differential/Platelet   Basic metabolic  panel   Hepatic function panel   Lipid panel   TSH   Hyperglycemia    Low carb diet and exercise.  Follow met b and a1c.       Relevant Orders   Hemoglobin A1c   Other Visit Diagnoses     Routine general medical examination at a health care facility    -  Primary        Einar Pheasant, MD

## 2020-11-27 ENCOUNTER — Encounter: Payer: Self-pay | Admitting: Internal Medicine

## 2020-11-27 ENCOUNTER — Telehealth: Payer: Self-pay | Admitting: Internal Medicine

## 2020-11-27 NOTE — Telephone Encounter (Signed)
States had colonoscopy at [facility].  Need copy of scope and need to know when due f/u.  (Thinks colonoscopy around 2014 - 2015).

## 2020-11-27 NOTE — Assessment & Plan Note (Signed)
Low carb diet and exercise.  Follow met b and a1c.  

## 2020-11-27 NOTE — Assessment & Plan Note (Addendum)
Followed by urology.  Stable.  

## 2020-11-27 NOTE — Assessment & Plan Note (Signed)
Physical today 11/28/20.  Urology following - history of prostate cancer.  Colonoscopy 01/2012.  States has in [place].  Need to see if can determine f/u recs.

## 2020-11-27 NOTE — Assessment & Plan Note (Signed)
On crestor.  Low cholesterol diet and exercise.  Follow lipid panel and liver function tests.   

## 2020-11-28 ENCOUNTER — Other Ambulatory Visit: Payer: Self-pay | Admitting: Internal Medicine

## 2020-11-30 NOTE — Telephone Encounter (Signed)
Faxed records request.

## 2020-12-02 ENCOUNTER — Other Ambulatory Visit (INDEPENDENT_AMBULATORY_CARE_PROVIDER_SITE_OTHER): Payer: BC Managed Care – PPO

## 2020-12-02 ENCOUNTER — Other Ambulatory Visit: Payer: Self-pay

## 2020-12-02 DIAGNOSIS — E7800 Pure hypercholesterolemia, unspecified: Secondary | ICD-10-CM

## 2020-12-02 DIAGNOSIS — R739 Hyperglycemia, unspecified: Secondary | ICD-10-CM | POA: Diagnosis not present

## 2020-12-02 LAB — CBC WITH DIFFERENTIAL/PLATELET
Basophils Absolute: 0.1 10*3/uL (ref 0.0–0.1)
Basophils Relative: 1.2 % (ref 0.0–3.0)
Eosinophils Absolute: 0.3 10*3/uL (ref 0.0–0.7)
Eosinophils Relative: 5.7 % — ABNORMAL HIGH (ref 0.0–5.0)
HCT: 41 % (ref 39.0–52.0)
Hemoglobin: 13.8 g/dL (ref 13.0–17.0)
Lymphocytes Relative: 23.4 % (ref 12.0–46.0)
Lymphs Abs: 1.3 10*3/uL (ref 0.7–4.0)
MCHC: 33.7 g/dL (ref 30.0–36.0)
MCV: 94.5 fl (ref 78.0–100.0)
Monocytes Absolute: 0.4 10*3/uL (ref 0.1–1.0)
Monocytes Relative: 7.2 % (ref 3.0–12.0)
Neutro Abs: 3.6 10*3/uL (ref 1.4–7.7)
Neutrophils Relative %: 62.5 % (ref 43.0–77.0)
Platelets: 191 10*3/uL (ref 150.0–400.0)
RBC: 4.34 Mil/uL (ref 4.22–5.81)
RDW: 12.5 % (ref 11.5–15.5)
WBC: 5.7 10*3/uL (ref 4.0–10.5)

## 2020-12-02 LAB — BASIC METABOLIC PANEL
BUN: 19 mg/dL (ref 6–23)
CO2: 32 mEq/L (ref 19–32)
Calcium: 9.3 mg/dL (ref 8.4–10.5)
Chloride: 102 mEq/L (ref 96–112)
Creatinine, Ser: 0.93 mg/dL (ref 0.40–1.50)
GFR: 87.15 mL/min (ref >60.00)
Glucose, Bld: 113 mg/dL — ABNORMAL HIGH (ref 70–99)
Potassium: 4.4 mEq/L (ref 3.5–5.1)
Sodium: 140 mEq/L (ref 135–145)

## 2020-12-02 LAB — LIPID PANEL
Cholesterol: 109 mg/dL (ref 0–200)
HDL: 45.7 mg/dL (ref >39.00)
LDL Cholesterol: 51 mg/dL (ref 0–99)
NonHDL: 63.38
Total CHOL/HDL Ratio: 2
Triglycerides: 60 mg/dL (ref 0.0–149.0)
VLDL: 12 mg/dL (ref 0.0–40.0)

## 2020-12-02 LAB — HEPATIC FUNCTION PANEL
ALT: 20 U/L (ref 0–53)
AST: 20 U/L (ref 0–37)
Albumin: 4.3 g/dL (ref 3.5–5.2)
Alkaline Phosphatase: 63 U/L (ref 39–117)
Bilirubin, Direct: 0.1 mg/dL (ref 0.0–0.3)
Total Bilirubin: 0.6 mg/dL (ref 0.2–1.2)
Total Protein: 6.7 g/dL (ref 6.0–8.3)

## 2020-12-02 LAB — HEMOGLOBIN A1C: Hgb A1c MFr Bld: 5.8 % (ref 4.6–6.5)

## 2020-12-02 LAB — TSH: TSH: 1.69 u[IU]/mL (ref 0.35–5.50)

## 2020-12-29 ENCOUNTER — Encounter: Payer: Self-pay | Admitting: Family

## 2020-12-29 ENCOUNTER — Ambulatory Visit (INDEPENDENT_AMBULATORY_CARE_PROVIDER_SITE_OTHER): Payer: BC Managed Care – PPO | Admitting: Family

## 2020-12-29 ENCOUNTER — Other Ambulatory Visit: Payer: Self-pay

## 2020-12-29 VITALS — BP 160/90 | HR 62 | Temp 96.3°F | Ht 68.0 in | Wt 198.4 lb

## 2020-12-29 DIAGNOSIS — B9689 Other specified bacterial agents as the cause of diseases classified elsewhere: Secondary | ICD-10-CM

## 2020-12-29 DIAGNOSIS — J019 Acute sinusitis, unspecified: Secondary | ICD-10-CM | POA: Diagnosis not present

## 2020-12-29 DIAGNOSIS — J3489 Other specified disorders of nose and nasal sinuses: Secondary | ICD-10-CM

## 2020-12-29 LAB — POC INFLUENZA A&B (BINAX/QUICKVUE)
Influenza A, POC: NEGATIVE
Influenza B, POC: NEGATIVE

## 2020-12-29 LAB — POC COVID19 BINAXNOW: SARS Coronavirus 2 Ag: NEGATIVE

## 2020-12-29 MED ORDER — AMOXICILLIN-POT CLAVULANATE 875-125 MG PO TABS: 1.0000 | ORAL_TABLET | Freq: Two times a day (BID) | ORAL | 0 refills | Status: DC

## 2020-12-29 NOTE — Progress Notes (Signed)
Acute Office Visit  Subjective:    [name]., male    DOB: 07/31/1955, 65 y.o.   MRN: [id number]  Chief Complaint  Patient presents with   Sinusitis   URI    HPI Patient is in today with c/o sinus pressure, burning sensation when he blows his nose and yellow drainage x 1 week and worsening. Denies any fever, chills, body aches or upset stomach. Tested for COVID at home and was negative. Has a h/o sinusitis.   Past Medical History:  Diagnosis Date   Allergy    Glaucoma    Hyperlipidemia    Prostate CA [facility])     Past Surgical History:  Procedure Laterality Date   BACK SURGERY  03/02/04   GLAUCOMA SURGERY Right 2012    Family History  Problem Relation Age of Onset   Hyperlipidemia Mother    Prostate cancer Father    Bone cancer Father     Social History   Socioeconomic History   Marital status: Married    Spouse name: Not on file   Number of children: Not on file   Years of education: Not on file   Highest education level: Not on file  Occupational History   Not on file  Tobacco Use   Smoking status: Never   Smokeless tobacco: Never  Vaping Use   Vaping Use: Never used  Substance and Sexual Activity   Alcohol use: No    Alcohol/week: 0.0 standard drinks   Drug use: No   Sexual activity: Not on file  Other Topics Concern   Not on file  Social History Narrative   Married with kids   Social Determinants of Health   Financial Resource Strain: Not on file  Food Insecurity: Not on file  Transportation Needs: Not on file  Physical Activity: Not on file  Stress: Not on file  Social Connections: Not on file  Intimate Partner Violence: Not on file    Outpatient Medications Prior to Visit  Medication Sig Dispense Refill   dorzolamide-timolol (COSOPT) 22.3-6.8 MG/ML ophthalmic solution      fexofenadine (ALLEGRA) 180 MG tablet Take 180 mg by mouth daily.     fluticasone (FLONASE) 50 MCG/ACT nasal spray Place 2 sprays into both  nostrils daily. In each nostril 16 g 12   LATANOPROST OP Apply 1 drop to eye at bedtime. Both eyes     methocarbamol (ROBAXIN) 500 MG tablet Take 1 tablet (500 mg total) by mouth 2 (two) times daily as needed for muscle spasms. 10 tablet 0   rosuvastatin (CRESTOR) 10 MG tablet TAKE 1 TABLET DAILY 90 tablet 3   No facility-administered medications prior to visit.    Allergies  Allergen Reactions   Contrast Media [Iodinated Diagnostic Agents] Rash    Review of Systems  Constitutional:  Negative for fatigue and fever.  HENT:  Positive for congestion, postnasal drip and sinus pressure.   Respiratory:  Positive for cough. Negative for shortness of breath and wheezing.   Cardiovascular: Negative.   Musculoskeletal: Negative.   Allergic/Immunologic: Negative.   Neurological: Negative.   Hematological: Negative.   Psychiatric/Behavioral: Negative.    All other systems reviewed and are negative.     Objective:    Physical Exam Vitals and nursing note reviewed.  Constitutional:      Appearance: Normal appearance.  HENT:     Head:     Comments: Frontal and maxillary sinus tenderness    Right Ear: Tympanic membrane and  ear canal normal.     Left Ear: Tympanic membrane and ear canal normal.  Cardiovascular:     Rate and Rhythm: Normal rate and regular rhythm.  Pulmonary:     Effort: Pulmonary effort is normal.     Breath sounds: Normal breath sounds.  Musculoskeletal:        General: Normal range of motion.     Cervical back: Normal range of motion and neck supple.  Skin:    General: Skin is warm and dry.  Neurological:     Mental Status: He is alert.  Psychiatric:        Mood and Affect: Mood normal.        Behavior: Behavior normal.    BP (!) 160/90    Pulse 62    Temp (!) 96.3 F (35.7 C) (Skin)    Ht 5\' 8"  (1.727 m)    Wt 198 lb 6.4 oz (90 kg)    SpO2 98%    BMI 30.17 kg/m  Wt Readings from Last 3 Encounters:  12/29/20 198 lb 6.4 oz (90 kg)  11/21/20 196 lb (88.9 kg)   02/15/20 198 lb 3.2 oz (89.9 kg)    Health Maintenance Due  Topic Date Due   HIV Screening  Never done   Hepatitis C Screening  Never done   COLONOSCOPY (Pts 45-46yrs Insurance coverage will need to be confirmed)  Never done   COVID-19 Vaccine (4 - Booster for [place] series) 03/07/2020    There are no preventive care reminders to display for this patient.   Lab Results  Component Value Date   TSH 1.69 12/02/2020   Lab Results  Component Value Date   WBC 5.7 12/02/2020   HGB 13.8 12/02/2020   HCT 41.0 12/02/2020   MCV 94.5 12/02/2020   PLT 191.0 12/02/2020   Lab Results  Component Value Date   NA 140 12/02/2020   K 4.4 12/02/2020   CO2 32 12/02/2020   GLUCOSE 113 (H) 12/02/2020   BUN 19 12/02/2020   CREATININE 0.93 12/02/2020   BILITOT 0.6 12/02/2020   ALKPHOS 63 12/02/2020   AST 20 12/02/2020   ALT 20 12/02/2020   PROT 6.7 12/02/2020   ALBUMIN 4.3 12/02/2020   CALCIUM 9.3 12/02/2020   ANIONGAP 7 02/23/2017   GFR 87.15 12/02/2020   Lab Results  Component Value Date   CHOL 109 12/02/2020   Lab Results  Component Value Date   HDL 45.70 12/02/2020   Lab Results  Component Value Date   LDLCALC 51 12/02/2020   Lab Results  Component Value Date   TRIG 60.0 12/02/2020   Lab Results  Component Value Date   CHOLHDL 2 12/02/2020   Lab Results  Component Value Date   HGBA1C 5.8 12/02/2020       Assessment & Plan:   Problem List Items Addressed This Visit   None Visit Diagnoses     Acute bacterial sinusitis    -  Primary   Relevant Medications   amoxicillin-clavulanate (AUGMENTIN) 875-125 MG tablet   Sinus pressure            Meds ordered this encounter  Medications   amoxicillin-clavulanate (AUGMENTIN) 875-125 MG tablet    Sig: Take 1 tablet by mouth 2 (two) times daily.    Dispense:  14 tablet    Refill:  0    Call the office if symptoms worsen or persist. Recheck as scheduled and sooner as needed.   Kennyth Arnold, FNP

## 2020-12-29 NOTE — Patient Instructions (Signed)

## 2021-01-13 ENCOUNTER — Ambulatory Visit
Admission: EM | Admit: 2021-01-13 | Discharge: 2021-01-13 | Disposition: A | Discharge status: Home / Self Care | Payer: BC Managed Care – PPO | Attending: Emergency Medicine | Admitting: Emergency Medicine

## 2021-01-13 DIAGNOSIS — U071 COVID-19: Secondary | ICD-10-CM

## 2021-01-13 MED ORDER — MOLNUPIRAVIR EUA 200MG CAPSULE
4.0000 | ORAL_CAPSULE | Freq: Two times a day (BID) | ORAL | 0 refills | Status: AC
Start: 2021-01-13 — End: 2021-01-18

## 2021-01-13 NOTE — Discharge Instructions (Signed)
Take the molnupiravir as directed.  Take Tylenol as needed for fever or discomfort.  Rest and keep yourself hydrated.  Continue to quarantine per CDC guidelines.    Go to the emergency department if you have shortness of breath or other concerning symptoms.

## 2021-01-13 NOTE — ED Provider Notes (Signed)
[name]    CSN: [id number] Arrival date & time: 01/13/21  1410      History   Chief Complaint Chief Complaint  Patient presents with   Cough    + COVID (home test)   Generalized Body Aches         HPI Jeremy Fischer. is a 65 y.o. male.  Patient presents with 1 day history of fatigue, body aches, cough.  He reports 1 episode of diarrhea today.  He tested positive for COVID at home today.  No treatment at home.  Denies fever, rash, shortness of breath, vomiting, or other symptoms.  His medical history includes prostate cancer, glaucoma, allergies, hyperlipidemia.  The history is provided by the patient and medical records.   Past Medical History:  Diagnosis Date   Allergy    Glaucoma    Hyperlipidemia    Prostate CA [facility])     Patient Active Problem List   Diagnosis Date Noted   Colon cancer screening 02/21/2020   Hypercholesterolemia 04/01/2018   Acute non-recurrent sinusitis 05/01/2017   History of prostate cancer 04/24/2015   Pre-op evaluation 04/24/2015   Health care maintenance 10/24/2014   Rash 10/24/2014   Elevated blood pressure reading 10/18/2013   Abnormal CT scan, neck 05/10/2013   Hyperglycemia 05/10/2013   BPH (benign prostatic hypertrophy) 10/22/2012   Glaucoma 10/15/2012   Environmental allergies 10/15/2012    Past Surgical History:  Procedure Laterality Date   BACK SURGERY  03/02/04   GLAUCOMA SURGERY Right 2012       Home Medications    Prior to Admission medications   Medication Sig Start Date End Date Taking? Authorizing Provider  molnupiravir EUA (LAGEVRIO) 200 mg CAPS capsule Take 4 capsules (800 mg total) by mouth 2 (two) times daily for 5 days. 01/13/21 01/18/21 Yes Sharion Balloon, NP  amoxicillin-clavulanate (AUGMENTIN) 875-125 MG tablet Take 1 tablet by mouth 2 (two) times daily. 12/29/20   Dutch Quint B, FNP  dorzolamide-timolol (COSOPT) 22.3-6.8 MG/ML ophthalmic solution  11/03/12   [provider]   fexofenadine (ALLEGRA) 180 MG tablet Take 180 mg by mouth daily.    [provider]  fluticasone (FLONASE) 50 MCG/ACT nasal spray Place 2 sprays into both nostrils daily. In each nostril 05/01/17   Venia Carbon, MD  LATANOPROST OP Apply 1 drop to eye at bedtime. Both eyes    [provider]  methocarbamol (ROBAXIN) 500 MG tablet Take 1 tablet (500 mg total) by mouth 2 (two) times daily as needed for muscle spasms. 09/24/20   Sharion Balloon, NP  rosuvastatin (CRESTOR) 10 MG tablet TAKE 1 TABLET DAILY 11/28/20   Einar Pheasant, MD    Family History Family History  Problem Relation Age of Onset   Hyperlipidemia Mother    Prostate cancer Father    Bone cancer Father     Social History Social History   Tobacco Use   Smoking status: Never   Smokeless tobacco: Never  Vaping Use   Vaping Use: Never used  Substance Use Topics   Alcohol use: No    Alcohol/week: 0.0 standard drinks   Drug use: No     Allergies   Contrast media [iodinated contrast media]   Review of Systems Review of Systems  Constitutional:  Positive for fatigue. Negative for chills and fever.  HENT:  Negative for ear pain and sore throat.   Respiratory:  Positive for cough. Negative for shortness of breath.   Cardiovascular:  Negative  for chest pain and palpitations.  Gastrointestinal:  Positive for diarrhea. Negative for abdominal pain and vomiting.  Skin:  Negative for color change and rash.  All other systems reviewed and are negative.   Physical Exam Triage Vital Signs ED Triage Vitals [01/13/21 1427]  Enc Vitals Group     BP (!) 159/82     Pulse Rate 74     Resp 18     Temp 99.2 F (37.3 C)     Temp Source Oral     SpO2 97 %     Weight      Height      Head Circumference      Peak Flow      Pain Score 0     Pain Loc      Pain Edu?      Excl. in [place]?    No data found.  Updated Vital Signs BP (!) 159/82 (BP Location: Left Arm)    Pulse 74    Temp 99.2 F (37.3 C)  (Oral)    Resp 18    SpO2 97%   Visual Acuity Right Eye Distance:   Left Eye Distance:   Bilateral Distance:    Right Eye Near:   Left Eye Near:    Bilateral Near:     Physical Exam Vitals and nursing note reviewed.  Constitutional:      General: He is not in acute distress.    Appearance: Normal appearance. He is well-developed.  HENT:     Right Ear: Tympanic membrane normal.     Left Ear: Tympanic membrane normal.     Nose: Nose normal.     Mouth/Throat:     Mouth: Mucous membranes are moist.     Pharynx: Oropharynx is clear.  Cardiovascular:     Rate and Rhythm: Normal rate and regular rhythm.     Heart sounds: Normal heart sounds.  Pulmonary:     Effort: Pulmonary effort is normal. No respiratory distress.     Breath sounds: Normal breath sounds.  Abdominal:     Palpations: Abdomen is soft.     Tenderness: There is no abdominal tenderness.  Musculoskeletal:     Cervical back: Neck supple.  Skin:    General: Skin is warm and dry.  Neurological:     Mental Status: He is alert.  Psychiatric:        Mood and Affect: Mood normal.        Behavior: Behavior normal.     UC Treatments / Results  Labs (all labs ordered are listed, but only abnormal results are displayed) Labs Reviewed - No data to display  EKG   Radiology No results found.  Procedures Procedures (including critical care time)  Medications Ordered in UC Medications - No data to display  Initial Impression / Assessment and Plan / UC Course  I have reviewed the triage vital signs and the nursing notes.  Pertinent labs & imaging results that were available during my care of the patient were reviewed by me and considered in my medical decision making (see chart for details).   COVID-19.  Patient tested positive for COVID at home.  Discussed treatment options.  Treating with molnupiravir.  Discussed that this is an emergency authorized medication for treatment of COVID.  Discussed side effects  including nausea, diarrhea, dizziness.  Discussed other symptomatic treatment including Tylenol, rest, hydration.  Instructed patient to follow-up with PCP if symptoms are not improving.  ED precautions discussed.  Patient  agrees to plan of care.    Final Clinical Impressions(s) / UC Diagnoses   Final diagnoses:  [id number]     Discharge Instructions      Take the molnupiravir as directed.  Take Tylenol as needed for fever or discomfort.  Rest and keep yourself hydrated.  Continue to quarantine per CDC guidelines.    Go to the emergency department if you have shortness of breath or other concerning symptoms.        ED Prescriptions     Medication Sig Dispense Auth. Provider   molnupiravir EUA (LAGEVRIO) 200 mg CAPS capsule Take 4 capsules (800 mg total) by mouth 2 (two) times daily for 5 days. 40 capsule Sharion Balloon, NP      PDMP not reviewed this encounter.   Sharion Balloon, NP 01/13/21 [phone]

## 2021-01-13 NOTE — ED Triage Notes (Signed)
Pt reports cough and body ache sx 1 day. Pt reports positive home COVID test today.

## 2021-02-08 DIAGNOSIS — C61 Malignant neoplasm of prostate: Secondary | ICD-10-CM | POA: Diagnosis not present

## 2021-02-08 DIAGNOSIS — D400 Neoplasm of uncertain behavior of prostate: Secondary | ICD-10-CM | POA: Diagnosis not present

## 2021-04-07 DIAGNOSIS — H401133 Primary open-angle glaucoma, bilateral, severe stage: Secondary | ICD-10-CM | POA: Diagnosis not present

## 2021-05-22 ENCOUNTER — Encounter: Payer: Self-pay | Admitting: Internal Medicine

## 2021-05-22 ENCOUNTER — Ambulatory Visit (INDEPENDENT_AMBULATORY_CARE_PROVIDER_SITE_OTHER): Payer: BC Managed Care – PPO | Admitting: Internal Medicine

## 2021-05-22 VITALS — BP 130/82 | HR 64 | Temp 98.3°F | Resp 15 | Ht 68.0 in | Wt 196.0 lb

## 2021-05-22 DIAGNOSIS — Z8546 Personal history of malignant neoplasm of prostate: Secondary | ICD-10-CM

## 2021-05-22 DIAGNOSIS — N400 Enlarged prostate without lower urinary tract symptoms: Secondary | ICD-10-CM

## 2021-05-22 DIAGNOSIS — R739 Hyperglycemia, unspecified: Secondary | ICD-10-CM

## 2021-05-22 DIAGNOSIS — E7800 Pure hypercholesterolemia, unspecified: Secondary | ICD-10-CM | POA: Diagnosis not present

## 2021-05-22 DIAGNOSIS — K219 Gastro-esophageal reflux disease without esophagitis: Secondary | ICD-10-CM

## 2021-05-22 DIAGNOSIS — R030 Elevated blood-pressure reading, without diagnosis of hypertension: Secondary | ICD-10-CM | POA: Diagnosis not present

## 2021-05-22 NOTE — Patient Instructions (Signed)
Pepcid (famotidine) 20mg - take one tablet 30 minutes before breakfast 

## 2021-05-22 NOTE — Progress Notes (Signed)
[name]., male   DOB: 1955/04/14, 66 y.o.   MRN: [id number] ? ? ?Subjective:  ? ? [name]., male    DOB: 1955/01/28, 66 y.o.   MRN: [id number] ? ?This visit occurred during the SARS-CoV-2 public health emergency.  Safety protocols were in place, including screening questions prior to the visit, additional usage of staff PPE, and extensive cleaning of exam room while observing appropriate contact time as indicated for disinfecting solutions.  ? ?Patient here for a scheduled follow up.  ? ?Chief Complaint  ?Patient presents with  ? Follow-up  ?  53mofollow up  ? .  ? ?HPI ?Here to follow up regarding his cholesterol and blood sugar.  Doing well.  Feels good.  Stays active.  Working.  No chest pain or sob reported.  No abdominal pain or bowel change reported.  Had covid 12/2020.  No residual problems.   ? ? ?Past Medical History:  ?Diagnosis Date  ? Allergy   ? Glaucoma   ? Hyperlipidemia   ? Prostate CA ([facility]   ? ?Past Surgical History:  ?Procedure Laterality Date  ? BACK SURGERY  03/02/04  ? GLAUCOMA SURGERY Right 2012  ? ?Family History  ?Problem Relation Age of Onset  ? Hyperlipidemia Mother   ? Prostate cancer Father   ? Bone cancer Father   ? ?Social History  ? ?Socioeconomic History  ? Marital status: Married  ?  Spouse name: Not on file  ? Number of children: Not on file  ? Years of education: Not on file  ? Highest education level: Not on file  ?Occupational History  ? Not on file  ?Tobacco Use  ? Smoking status: Never  ? Smokeless tobacco: Never  ?Vaping Use  ? Vaping Use: Never used  ?Substance and Sexual Activity  ? Alcohol use: No  ?  Alcohol/week: 0.0 standard drinks  ? Drug use: No  ? Sexual activity: Not on file  ?Other Topics Concern  ? Not on file  ?Social History Narrative  ? Married with kids  ? ?Social Determinants of Health  ? ?Financial Resource Strain: Not on file  ?Food Insecurity: Not on file  ?Transportation Needs: Not on file  ?Physical  Activity: Not on file  ?Stress: Not on file  ?Social Connections: Not on file  ? ? ? ?Review of Systems  ?Constitutional:  Negative for appetite change and unexpected weight change.  ?HENT:  Negative for congestion and sinus pressure.   ?Respiratory:  Negative for cough, chest tightness and shortness of breath.   ?Cardiovascular:  Negative for chest pain, palpitations and leg swelling.  ?Gastrointestinal:  Negative for abdominal pain, diarrhea, nausea and vomiting.  ?Genitourinary:  Negative for difficulty urinating and dysuria.  ?Musculoskeletal:  Negative for joint swelling and myalgias.  ?Skin:  Negative for color change and rash.  ?Neurological:  Negative for dizziness, light-headedness and headaches.  ?Psychiatric/Behavioral:  Negative for agitation and dysphoric mood.   ? ?   ?Objective:  ?  ? ?BP 130/82   Pulse 64   Temp 98.3 ?F (36.8 ?C) (Temporal)   Resp 15   Ht _0  (1.727 m)   Wt 196 lb (88.9 kg)   SpO2 97%   BMI 29.80 kg/m?  ?Wt Readings from Last 3 Encounters:  ?05/22/21 196 lb (88.9 kg)  ?12/29/20 198 lb 6.4 oz (90 kg)  ?11/21/20 196 lb (88.9 kg)  ? ? ?Physical Exam ?Constitutional:   ?  General: He is not in acute distress. ?   Appearance: Normal appearance. He is well-developed.  ?HENT:  ?   Head: Normocephalic and atraumatic.  ?   Right Ear: External ear normal.  ?   Left Ear: External ear normal.  ?Eyes:  ?   General: No scleral icterus.    ?   Right eye: No discharge.     ?   Left eye: No discharge.  ?Cardiovascular:  ?   Rate and Rhythm: Normal rate and regular rhythm.  ?Pulmonary:  ?   Effort: Pulmonary effort is normal. No respiratory distress.  ?   Breath sounds: Normal breath sounds.  ?Abdominal:  ?   General: Bowel sounds are normal.  ?   Palpations: Abdomen is soft.  ?   Tenderness: There is no abdominal tenderness.  ?Musculoskeletal:     ?   General: No swelling or tenderness.  ?   Cervical back: Neck supple. No tenderness.  ?Lymphadenopathy:  ?   Cervical: No cervical adenopathy.   ?Skin: ?   Findings: No erythema or rash.  ?Neurological:  ?   Mental Status: He is alert.  ?Psychiatric:     ?   Mood and Affect: Mood normal.     ?   Behavior: Behavior normal.  ? ? ? ?Outpatient Encounter Medications as of 05/22/2021  ?Medication Sig  ? amoxicillin-clavulanate (AUGMENTIN) 875-125 MG tablet Take 1 tablet by mouth 2 (two) times daily.  ? dorzolamide-timolol (COSOPT) 22.3-6.8 MG/ML ophthalmic solution   ? fexofenadine (ALLEGRA) 180 MG tablet Take 180 mg by mouth daily.  ? fluticasone (FLONASE) 50 MCG/ACT nasal spray Place 2 sprays into both nostrils daily. In each nostril  ? latanoprost (XALATAN) 0.005 % ophthalmic solution 1 drop at bedtime.  ? methocarbamol (ROBAXIN) 500 MG tablet Take 1 tablet (500 mg total) by mouth 2 (two) times daily as needed for muscle spasms.  ? rosuvastatin (CRESTOR) 10 MG tablet TAKE 1 TABLET DAILY  ? [DISCONTINUED] LATANOPROST OP Apply 1 drop to eye at bedtime. Both eyes  ? ?No facility-administered encounter medications on file as of 05/22/2021.  ?  ? ?Lab Results  ?Component Value Date  ? WBC 5.7 12/02/2020  ? HGB 13.8 12/02/2020  ? HCT 41.0 12/02/2020  ? PLT 191.0 12/02/2020  ? GLUCOSE 113 (H) 12/02/2020  ? CHOL 109 12/02/2020  ? TRIG 60.0 12/02/2020  ? HDL 45.70 12/02/2020  ? [place] 51 12/02/2020  ? ALT 20 12/02/2020  ? AST 20 12/02/2020  ? NA 140 12/02/2020  ? K 4.4 12/02/2020  ? CL 102 12/02/2020  ? CREATININE 0.93 12/02/2020  ? BUN 19 12/02/2020  ? CO2 32 12/02/2020  ? TSH 1.69 12/02/2020  ? INR 1.0 12/19/2012  ? HGBA1C 5.8 12/02/2020  ? ? ?   ?Assessment & Plan:  ? ?Problem List Items Addressed This Visit   ? ? Benign prostatic hyperplasia  ?  Followed by urology (Dr Yves Dill).  ? ?  ?  ? Elevated blood pressure reading  ?  Elevated on initial check.  Recheck 130/82.  Follow pressures.  Follow metabolic panel.  ? ?  ?  ? GERD (gastroesophageal reflux disease)  ?  Occasional symptoms.  Trial of pepcid.  Follow.  Notify me if persistent.  ? ?  ?  ? History of prostate  cancer  ?  Followed by urology.  Stable. ? ?  ?  ? Relevant Orders  ? PSA  ? Hypercholesterolemia  ?  On crestor.  Low cholesterol diet and exercise.  Follow lipid panel and liver function tests.   ? ?  ?  ? Relevant Orders  ? Lipid Profile  ? Hepatic function panel  ? Basic Metabolic Panel (BMET)  ? Hyperglycemia - Primary  ?  Low carb diet and exercise.  Follow met b and a1c.  ? ?  ?  ? Relevant Orders  ? HgB A1c  ? ? ? ?Einar Pheasant, MD  ?

## 2021-05-28 ENCOUNTER — Encounter: Payer: Self-pay | Admitting: Internal Medicine

## 2021-05-28 DIAGNOSIS — K219 Gastro-esophageal reflux disease without esophagitis: Secondary | ICD-10-CM | POA: Insufficient documentation

## 2021-05-28 NOTE — Assessment & Plan Note (Signed)
Low carb diet and exercise.  Follow met b and a1c.  ?

## 2021-05-28 NOTE — Assessment & Plan Note (Signed)
Followed by urology.  Stable.  

## 2021-05-28 NOTE — Assessment & Plan Note (Signed)
Occasional symptoms.  Trial of pepcid.  Follow.  Notify me if persistent.  ?

## 2021-05-28 NOTE — Assessment & Plan Note (Signed)
Elevated on initial check.  Recheck 130/82.  Follow pressures.  Follow metabolic panel.  ?

## 2021-05-28 NOTE — Assessment & Plan Note (Signed)
Followed by urology (Dr Yves Dill).  ?

## 2021-05-28 NOTE — Assessment & Plan Note (Signed)
On crestor.  Low cholesterol diet and exercise.  Follow lipid panel and liver function tests.   

## 2021-06-01 DIAGNOSIS — C44519 Basal cell carcinoma of skin of other part of trunk: Secondary | ICD-10-CM | POA: Diagnosis not present

## 2021-06-01 DIAGNOSIS — D2261 Melanocytic nevi of right upper limb, including shoulder: Secondary | ICD-10-CM | POA: Diagnosis not present

## 2021-06-01 DIAGNOSIS — L570 Actinic keratosis: Secondary | ICD-10-CM | POA: Diagnosis not present

## 2021-06-01 DIAGNOSIS — D225 Melanocytic nevi of trunk: Secondary | ICD-10-CM | POA: Diagnosis not present

## 2021-06-01 DIAGNOSIS — D485 Neoplasm of uncertain behavior of skin: Secondary | ICD-10-CM | POA: Diagnosis not present

## 2021-06-01 DIAGNOSIS — D2272 Melanocytic nevi of left lower limb, including hip: Secondary | ICD-10-CM | POA: Diagnosis not present

## 2021-06-01 DIAGNOSIS — D2262 Melanocytic nevi of left upper limb, including shoulder: Secondary | ICD-10-CM | POA: Diagnosis not present

## 2021-06-05 ENCOUNTER — Other Ambulatory Visit (INDEPENDENT_AMBULATORY_CARE_PROVIDER_SITE_OTHER): Payer: BC Managed Care – PPO

## 2021-06-05 DIAGNOSIS — E7800 Pure hypercholesterolemia, unspecified: Secondary | ICD-10-CM

## 2021-06-05 DIAGNOSIS — R739 Hyperglycemia, unspecified: Secondary | ICD-10-CM

## 2021-06-05 DIAGNOSIS — Z8546 Personal history of malignant neoplasm of prostate: Secondary | ICD-10-CM | POA: Diagnosis not present

## 2021-06-05 LAB — LIPID PANEL
Cholesterol: 132 mg/dL (ref 0–200)
HDL: 47.9 mg/dL (ref >39.00)
LDL Cholesterol: 69 mg/dL (ref 0–99)
NonHDL: 84.24
Total CHOL/HDL Ratio: 3
Triglycerides: 76 mg/dL (ref 0.0–149.0)
VLDL: 15.2 mg/dL (ref 0.0–40.0)

## 2021-06-05 LAB — BASIC METABOLIC PANEL
BUN: 18 mg/dL (ref 6–23)
CO2: 31 mEq/L (ref 19–32)
Calcium: 9.1 mg/dL (ref 8.4–10.5)
Chloride: 103 mEq/L (ref 96–112)
Creatinine, Ser: 0.96 mg/dL (ref 0.40–1.50)
GFR: 83.6 mL/min (ref >60.00)
Glucose, Bld: 119 mg/dL — ABNORMAL HIGH (ref 70–99)
Potassium: 4.3 mEq/L (ref 3.5–5.1)
Sodium: 140 mEq/L (ref 135–145)

## 2021-06-05 LAB — HEPATIC FUNCTION PANEL
ALT: 22 U/L (ref 0–53)
AST: 18 U/L (ref 0–37)
Albumin: 4.2 g/dL (ref 3.5–5.2)
Alkaline Phosphatase: 60 U/L (ref 39–117)
Bilirubin, Direct: 0.1 mg/dL (ref 0.0–0.3)
Total Bilirubin: 0.5 mg/dL (ref 0.2–1.2)
Total Protein: 6.6 g/dL (ref 6.0–8.3)

## 2021-06-05 LAB — HEMOGLOBIN A1C: Hgb A1c MFr Bld: 5.8 % (ref 4.6–6.5)

## 2021-06-05 LAB — PSA: PSA: 1.55 ng/mL (ref 0.10–4.00)

## 2021-07-17 ENCOUNTER — Encounter: Payer: Self-pay | Admitting: Internal Medicine

## 2021-07-17 ENCOUNTER — Ambulatory Visit (INDEPENDENT_AMBULATORY_CARE_PROVIDER_SITE_OTHER): Payer: BC Managed Care – PPO | Admitting: Internal Medicine

## 2021-07-17 DIAGNOSIS — E7800 Pure hypercholesterolemia, unspecified: Secondary | ICD-10-CM

## 2021-07-17 DIAGNOSIS — Z8546 Personal history of malignant neoplasm of prostate: Secondary | ICD-10-CM | POA: Diagnosis not present

## 2021-07-17 DIAGNOSIS — K219 Gastro-esophageal reflux disease without esophagitis: Secondary | ICD-10-CM | POA: Diagnosis not present

## 2021-07-17 DIAGNOSIS — N400 Enlarged prostate without lower urinary tract symptoms: Secondary | ICD-10-CM | POA: Diagnosis not present

## 2021-07-17 DIAGNOSIS — R739 Hyperglycemia, unspecified: Secondary | ICD-10-CM

## 2021-07-17 NOTE — Progress Notes (Signed)
Patient ID: Jeremy Harvest., male   DOB: 02-Dec-1955, 66 y.o.   MRN: [id number]   Subjective:    Patient ID: Jeremy Harvest., male    DOB: [birth year], 66 y.o.   MRN: [id number]   Patient here for a scheduled follow up.   Chief Complaint  Patient presents with   Gastroesophageal Reflux   .   HPI Last visit, was having some issues with reflux.  Discussed trial of pepcid.  States if takes pepcid and waits 50 minutes - controls symptoms.  No significant acid reflux now.  No swallowing problems.  No chest pain or sob.  No cough or congestion.  No abdominal pain or bowel change.  Overall he feels he is doing well.    Past Medical History:  Diagnosis Date   Allergy    Glaucoma    Hyperlipidemia    Prostate CA [facility])    Past Surgical History:  Procedure Laterality Date   BACK SURGERY  03/02/04   GLAUCOMA SURGERY Right 2012   Family History  Problem Relation Age of Onset   Hyperlipidemia Mother    Prostate cancer Father    Bone cancer Father    Social History   Socioeconomic History   Marital status: Married    Spouse name: Not on file   Number of children: Not on file   Years of education: Not on file   Highest education level: Not on file  Occupational History   Not on file  Tobacco Use   Smoking status: Never   Smokeless tobacco: Never  Vaping Use   Vaping Use: Never used  Substance and Sexual Activity   Alcohol use: No    Alcohol/week: 0.0 standard drinks of alcohol   Drug use: No   Sexual activity: Not on file  Other Topics Concern   Not on file  Social History Narrative   Married with kids   Social Determinants of Health   Financial Resource Strain: Not on file  Food Insecurity: Not on file  Transportation Needs: Not on file  Physical Activity: Not on file  Stress: Not on file  Social Connections: Not on file     Review of Systems  Constitutional:  Negative for appetite change and unexpected weight change.  HENT:  Negative for  congestion and sinus pressure.   Respiratory:  Negative for cough, chest tightness and shortness of breath.   Cardiovascular:  Negative for chest pain, palpitations and leg swelling.  Gastrointestinal:  Negative for abdominal pain, diarrhea, nausea and vomiting.  Genitourinary:  Negative for difficulty urinating and dysuria.  Musculoskeletal:  Negative for joint swelling and myalgias.  Skin:  Negative for color change and rash.  Neurological:  Negative for dizziness, light-headedness and headaches.  Psychiatric/Behavioral:  Negative for agitation and dysphoric mood.        Objective:     BP 130/76 (BP Location: Left Arm, Patient Position: Sitting, Cuff Size: Large)   Pulse 72   Temp 98.3 F (36.8 C) (Temporal)   Resp 15   Ht '5\' 8"'  (1.727 m)   Wt 197 lb 9.6 oz (89.6 kg)   SpO2 98%   BMI 30.04 kg/m  Wt Readings from Last 3 Encounters:  07/17/21 197 lb 9.6 oz (89.6 kg)  05/22/21 196 lb (88.9 kg)  12/29/20 198 lb 6.4 oz (90 kg)    Physical Exam Constitutional:      General: He is not in acute distress.    Appearance: Normal appearance. He is  well-developed.  HENT:     Head: Normocephalic and atraumatic.     Right Ear: External ear normal.     Left Ear: External ear normal.  Eyes:     General: No scleral icterus.       Right eye: No discharge.        Left eye: No discharge.  Cardiovascular:     Rate and Rhythm: Normal rate and regular rhythm.  Pulmonary:     Effort: Pulmonary effort is normal. No respiratory distress.     Breath sounds: Normal breath sounds.  Abdominal:     General: Bowel sounds are normal.     Palpations: Abdomen is soft.     Tenderness: There is no abdominal tenderness.  Musculoskeletal:        General: No swelling or tenderness.     Cervical back: Neck supple. No tenderness.  Lymphadenopathy:     Cervical: No cervical adenopathy.  Skin:    Findings: No erythema or rash.  Neurological:     Mental Status: He is alert.  Psychiatric:         Mood and Affect: Mood normal.        Behavior: Behavior normal.      Outpatient Encounter Medications as of 07/17/2021  Medication Sig   dorzolamide-timolol (COSOPT) 22.3-6.8 MG/ML ophthalmic solution    fexofenadine (ALLEGRA) 180 MG tablet Take 180 mg by mouth daily.   fluticasone (FLONASE) 50 MCG/ACT nasal spray Place 2 sprays into both nostrils daily. In each nostril   latanoprost (XALATAN) 0.005 % ophthalmic solution 1 drop at bedtime.   methocarbamol (ROBAXIN) 500 MG tablet Take 1 tablet (500 mg total) by mouth 2 (two) times daily as needed for muscle spasms.   rosuvastatin (CRESTOR) 10 MG tablet TAKE 1 TABLET DAILY   [DISCONTINUED] amoxicillin-clavulanate (AUGMENTIN) 875-125 MG tablet Take 1 tablet by mouth 2 (two) times daily.   No facility-administered encounter medications on file as of 07/17/2021.     Lab Results  Component Value Date   WBC 5.7 12/02/2020   HGB 13.8 12/02/2020   HCT 41.0 12/02/2020   PLT 191.0 12/02/2020   GLUCOSE 119 (H) 06/05/2021   CHOL 132 06/05/2021   TRIG 76.0 06/05/2021   HDL 47.90 06/05/2021   LDLCALC 69 06/05/2021   ALT 22 06/05/2021   AST 18 06/05/2021   NA 140 06/05/2021   K 4.3 06/05/2021   CL 103 06/05/2021   CREATININE 0.96 06/05/2021   BUN 18 06/05/2021   CO2 31 06/05/2021   TSH 1.69 12/02/2020   PSA 1.55 06/05/2021   INR 1.0 12/19/2012   HGBA1C 5.8 06/05/2021       Assessment & Plan:   Problem List Items Addressed This Visit     Benign prostatic hyperplasia    Has been followed by Dr Yves Dill (urology).       GERD (gastroesophageal reflux disease)    Was instructed last visit to start pepcid.  Taking just prn now.  Symptoms controlled.  Follow.       History of prostate cancer    Followed by urology.  Stable per report.       Hypercholesterolemia    On crestor.  Low cholesterol diet and exercise.  Follow lipid panel and liver function tests.        Relevant Orders   CBC with Differential/Platelet   Hepatic function  panel   Lipid panel   TSH   Hyperglycemia    Low carb diet and exercise.  Follow met b and a1c.       Relevant Orders   Basic metabolic panel   Hemoglobin A1c     Einar Pheasant, MD

## 2021-07-23 ENCOUNTER — Encounter: Payer: Self-pay | Admitting: Internal Medicine

## 2021-07-23 NOTE — Assessment & Plan Note (Signed)
Has been followed by Dr Yves Dill (urology).

## 2021-07-23 NOTE — Assessment & Plan Note (Signed)
Was instructed last visit to start pepcid.  Taking just prn now.  Symptoms controlled.  Follow.

## 2021-07-23 NOTE — Assessment & Plan Note (Signed)
Low carb diet and exercise.  Follow met b and a1c.  

## 2021-07-23 NOTE — Assessment & Plan Note (Signed)
Followed by urology.  Stable per report.  

## 2021-07-23 NOTE — Assessment & Plan Note (Signed)
On crestor.  Low cholesterol diet and exercise.  Follow lipid panel and liver function tests.   

## 2021-08-07 DIAGNOSIS — D400 Neoplasm of uncertain behavior of prostate: Secondary | ICD-10-CM | POA: Diagnosis not present

## 2021-08-07 DIAGNOSIS — C61 Malignant neoplasm of prostate: Secondary | ICD-10-CM | POA: Diagnosis not present

## 2021-08-14 ENCOUNTER — Ambulatory Visit
Admission: EM | Admit: 2021-08-14 | Discharge: 2021-08-14 | Disposition: A | Discharge status: Home / Self Care | Payer: BC Managed Care – PPO | Attending: Emergency Medicine | Admitting: Emergency Medicine

## 2021-08-14 ENCOUNTER — Ambulatory Visit (INDEPENDENT_AMBULATORY_CARE_PROVIDER_SITE_OTHER): Payer: BC Managed Care – PPO

## 2021-08-14 DIAGNOSIS — M546 Pain in thoracic spine: Secondary | ICD-10-CM

## 2021-08-14 MED ORDER — PREDNISONE 20 MG PO TABS: 40.0000 mg | ORAL_TABLET | Freq: Every day | ORAL | 0 refills | Status: DC

## 2021-08-14 MED ORDER — METHYLPREDNISOLONE SODIUM SUCC 125 MG IJ SOLR
60.0000 mg | Freq: Once | INTRAMUSCULAR | Status: AC
  Administered 2021-08-14: 60 mg via INTRAMUSCULAR

## 2021-08-14 MED ORDER — CYCLOBENZAPRINE HCL 10 MG PO TABS: 10.0000 mg | ORAL_TABLET | Freq: Every day | ORAL | 0 refills | Status: DC

## 2021-08-14 NOTE — ED Provider Notes (Signed)
Jeremy Fischer    CSN: [id number] Arrival date & time: 08/14/21  1707      History   Chief Complaint No chief complaint on file.   HPI Jeremy Fischer. is a 66 y.o. male.   Patient presents with pain between the shoulder blades for 3 weeks.  Symptoms initially began after an adjustment by his chiropractor which to been going to monthly for 35 years.  Pain has been constant, does not radiate and is described as a dull aching.  Symptoms are worsened when reaching to get objects.  Able to twist, turn and bend.  Denies numbness or tingling, injury or trauma.  Has attempted use of ibuprofen 400 mg twice daily which has been reducing pain but not resolving.  Past Medical History:  Diagnosis Date   Allergy    Glaucoma    Hyperlipidemia    Prostate CA [facility])     Patient Active Problem List   Diagnosis Date Noted   GERD (gastroesophageal reflux disease) 05/28/2021   Colon cancer screening 02/21/2020   Hypercholesterolemia 04/01/2018   Acute non-recurrent sinusitis 05/01/2017   History of prostate cancer 04/24/2015   Pre-op evaluation 04/24/2015   Health care maintenance 10/24/2014   Rash 10/24/2014   Elevated blood pressure reading 10/18/2013   Abnormal CT scan, neck 05/10/2013   Hyperglycemia 05/10/2013   Benign prostatic hyperplasia 10/22/2012   Glaucoma 10/15/2012   Environmental allergies 10/15/2012    Past Surgical History:  Procedure Laterality Date   BACK SURGERY  03/02/04   GLAUCOMA SURGERY Right 2012       Home Medications    Prior to Admission medications   Medication Sig Start Date End Date Taking? Authorizing Provider  cyclobenzaprine (FLEXERIL) 10 MG tablet Take 1 tablet (10 mg total) by mouth at bedtime. 08/14/21  Yes Arn Mcomber R, NP  predniSONE (DELTASONE) 20 MG tablet Take 2 tablets (40 mg total) by mouth daily. 08/14/21  Yes Hans Eden, NP  dorzolamide-timolol (COSOPT) 22.3-6.8 MG/ML ophthalmic solution  11/03/12   [provider]  fexofenadine (ALLEGRA) 180 MG tablet Take 180 mg by mouth daily.    [provider]  fluticasone (FLONASE) 50 MCG/ACT nasal spray Place 2 sprays into both nostrils daily. In each nostril 05/01/17   Venia Carbon, MD  latanoprost (XALATAN) 0.005 % ophthalmic solution 1 drop at bedtime. 04/07/21   [provider]  methocarbamol (ROBAXIN) 500 MG tablet Take 1 tablet (500 mg total) by mouth 2 (two) times daily as needed for muscle spasms. 09/24/20   Sharion Balloon, NP  rosuvastatin (CRESTOR) 10 MG tablet TAKE 1 TABLET DAILY 11/28/20   Einar Pheasant, MD    Family History Family History  Problem Relation Age of Onset   Hyperlipidemia Mother    Prostate cancer Father    Bone cancer Father     Social History Social History   Tobacco Use   Smoking status: Never   Smokeless tobacco: Never  Vaping Use   Vaping Use: Never used  Substance Use Topics   Alcohol use: No    Alcohol/week: 0.0 standard drinks of alcohol   Drug use: No     Allergies   Contrast media [iodinated contrast media]   Review of Systems Review of Systems  Constitutional: Negative.   Respiratory: Negative.    Cardiovascular: Negative.   Musculoskeletal:  Positive for myalgias. Negative for arthralgias, back pain, gait problem, joint swelling, neck pain and neck stiffness.  Skin: Negative.  Neurological: Negative.      Physical Exam Triage Vital Signs ED Triage Vitals  Enc Vitals Group     BP 08/14/21 1743 (!) 152/76     Pulse Rate 08/14/21 1743 61     Resp 08/14/21 1743 18     Temp 08/14/21 1743 98.6 F (37 C)     Temp Source 08/14/21 1743 Oral     SpO2 08/14/21 1743 96 %     Weight --      Height --      Head Circumference --      Peak Flow --      Pain Score 08/14/21 1744 10     Pain Loc --      Pain Edu? --      Excl. in [place]? --    No data found.  Updated Vital Signs BP (!) 152/76 (BP Location: Left Arm)   Pulse 61   Temp 98.6 F (37 C) (Oral)   Resp  18   SpO2 96%   Visual Acuity Right Eye Distance:   Left Eye Distance:   Bilateral Distance:    Right Eye Near:   Left Eye Near:    Bilateral Near:     Physical Exam Constitutional:      Appearance: Normal appearance.  HENT:     Head: Normocephalic.  Eyes:     Extraocular Movements: Extraocular movements intact.  Pulmonary:     Effort: Pulmonary effort is normal.  Musculoskeletal:     Comments: Tenderness present within the thoracic region directly between the shoulder blades, no ecchymosis, swelling or deformity present, range of motion intact, able to bear weight, negative straight leg test  Skin:    General: Skin is warm and dry.  Neurological:     Mental Status: He is alert and oriented to person, place, and time. Mental status is at baseline.  Psychiatric:        Mood and Affect: Mood normal.        Behavior: Behavior normal.      UC Treatments / Results  Labs (all labs ordered are listed, but only abnormal results are displayed) Labs Reviewed - No data to display  EKG   Radiology No results found.  Procedures Procedures (including critical care time)  Medications Ordered in UC Medications  methylPREDNISolone sodium succinate (SOLU-MEDROL) 125 mg/2 mL injection 60 mg (has no administration in time range)    Initial Impression / Assessment and Plan / UC Course  I have reviewed the triage vital signs and the nursing notes.  Pertinent labs & imaging results that were available during my care of the patient were reviewed by me and considered in my medical decision making (see chart for details).  Acute midline thoracic back pain  Vital signs are stable, patient is in no signs of distress, tenderness is noted on exam, thoracic x-ray is negative, discussed findings with patient, methylprednisolone injection given in office and prednisone and Flexeril prescribed for outpatient management, recommended RICE, heat, massage, daily stretching and pills for support  with activity as tolerated, given walker referral to orthopedics if symptoms continue to persist or worsen Final Clinical Impressions(s) / UC Diagnoses   Final diagnoses:  Acute midline thoracic back pain     Discharge Instructions      Your pain is most likely caused by irritation to the muscles or ligaments.   Starting tomorrow take prednisone every morning with food for the next 5 days to continue the reduction of inflammation related  to injury  You may use muscle relaxer at bedtime for additional comfort as needed, be mindful this medication may make you drowsy  You may use heating pad in 15 minute intervals as needed for additional comfort, or  you may find comfort in using ice in 10-15 minutes over affected area  Begin stretching affected area daily for 10 minutes as tolerated to further loosen muscles   When lying down place pillow underneath and between knees for support  Can try sleeping without pillow on firm mattress   Practice good posture: head back, shoulders back, chest forward, pelvis back and weight distributed evenly on both legs  If pain persist after recommended treatment or reoccurs if may be beneficial to follow up with orthopedic specialist for evaluation, this doctor specializes in the bones and can manage your symptoms long-term with options such as but not limited to imaging, medications or physical therapy      ED Prescriptions     Medication Sig Dispense Auth. Provider   predniSONE (DELTASONE) 20 MG tablet Take 2 tablets (40 mg total) by mouth daily. 10 tablet Zedrick Springsteen, Vincente Liberty R, NP   cyclobenzaprine (FLEXERIL) 10 MG tablet Take 1 tablet (10 mg total) by mouth at bedtime. 10 tablet Hans Eden, NP      PDMP not reviewed this encounter.   Hans Eden, NP 08/14/21 1859

## 2021-08-14 NOTE — ED Triage Notes (Signed)
Pt presents with scapular/ thoracic pain x a few weeks.  Started morning after a chiropractic visit. Has tried ibuprofen, has not tried heat. States he is sore on the inside.

## 2021-08-14 NOTE — Discharge Instructions (Addendum)
Your pain is most likely caused by irritation to the muscles or ligaments.   X-ray of thoracic spine is negative  Starting tomorrow take prednisone every morning with food for the next 5 days to continue the reduction of inflammation related to injury  You may use muscle relaxer at bedtime for additional comfort as needed, be mindful this medication may make you drowsy  You may use heating pad in 15 minute intervals as needed for additional comfort, or  you may find comfort in using ice in 10-15 minutes over affected area  Begin stretching affected area daily for 10 minutes as tolerated to further loosen muscles   When lying down place pillow underneath and between knees for support  Can try sleeping without pillow on firm mattress   Practice good posture: head back, shoulders back, chest forward, pelvis back and weight distributed evenly on both legs  If pain persist after recommended treatment or reoccurs if may be beneficial to follow up with orthopedic specialist for evaluation, this doctor specializes in the bones and can manage your symptoms long-term with options such as but not limited to imaging, medications or physical therapy

## 2021-08-21 DIAGNOSIS — C44519 Basal cell carcinoma of skin of other part of trunk: Secondary | ICD-10-CM | POA: Diagnosis not present

## 2021-10-04 DIAGNOSIS — H401133 Primary open-angle glaucoma, bilateral, severe stage: Secondary | ICD-10-CM | POA: Diagnosis not present

## 2021-10-16 ENCOUNTER — Other Ambulatory Visit: Payer: BC Managed Care – PPO

## 2021-10-17 ENCOUNTER — Other Ambulatory Visit (INDEPENDENT_AMBULATORY_CARE_PROVIDER_SITE_OTHER): Payer: BC Managed Care – PPO

## 2021-10-17 DIAGNOSIS — R739 Hyperglycemia, unspecified: Secondary | ICD-10-CM | POA: Diagnosis not present

## 2021-10-17 DIAGNOSIS — E7800 Pure hypercholesterolemia, unspecified: Secondary | ICD-10-CM | POA: Diagnosis not present

## 2021-10-17 DIAGNOSIS — H401133 Primary open-angle glaucoma, bilateral, severe stage: Secondary | ICD-10-CM | POA: Diagnosis not present

## 2021-10-17 LAB — CBC WITH DIFFERENTIAL/PLATELET
Basophils Absolute: 0 10*3/uL (ref 0.0–0.1)
Basophils Relative: 0.9 % (ref 0.0–3.0)
Eosinophils Absolute: 0.2 10*3/uL (ref 0.0–0.7)
Eosinophils Relative: 5 % (ref 0.0–5.0)
HCT: 39.8 % (ref 39.0–52.0)
Hemoglobin: 13.7 g/dL (ref 13.0–17.0)
Lymphocytes Relative: 27.5 % (ref 12.0–46.0)
Lymphs Abs: 1.4 10*3/uL (ref 0.7–4.0)
MCHC: 34.5 g/dL (ref 30.0–36.0)
MCV: 94.9 fl (ref 78.0–100.0)
Monocytes Absolute: 0.4 10*3/uL (ref 0.1–1.0)
Monocytes Relative: 8.3 % (ref 3.0–12.0)
Neutro Abs: 2.9 10*3/uL (ref 1.4–7.7)
Neutrophils Relative %: 58.3 % (ref 43.0–77.0)
Platelets: 182 10*3/uL (ref 150.0–400.0)
RBC: 4.2 Mil/uL — ABNORMAL LOW (ref 4.22–5.81)
RDW: 13 % (ref 11.5–15.5)
WBC: 4.9 10*3/uL (ref 4.0–10.5)

## 2021-10-17 LAB — HEPATIC FUNCTION PANEL
ALT: 17 U/L (ref 0–53)
AST: 17 U/L (ref 0–37)
Albumin: 4.1 g/dL (ref 3.5–5.2)
Alkaline Phosphatase: 56 U/L (ref 39–117)
Bilirubin, Direct: 0.1 mg/dL (ref 0.0–0.3)
Total Bilirubin: 0.6 mg/dL (ref 0.2–1.2)
Total Protein: 6.4 g/dL (ref 6.0–8.3)

## 2021-10-17 LAB — LIPID PANEL
Cholesterol: 162 mg/dL (ref 0–200)
HDL: 45.6 mg/dL (ref >39.00)
LDL Cholesterol: 103 mg/dL — ABNORMAL HIGH (ref 0–99)
NonHDL: 116.86
Total CHOL/HDL Ratio: 4
Triglycerides: 70 mg/dL (ref 0.0–149.0)
VLDL: 14 mg/dL (ref 0.0–40.0)

## 2021-10-17 LAB — BASIC METABOLIC PANEL
BUN: 15 mg/dL (ref 6–23)
CO2: 29 mEq/L (ref 19–32)
Calcium: 9.1 mg/dL (ref 8.4–10.5)
Chloride: 103 mEq/L (ref 96–112)
Creatinine, Ser: 0.91 mg/dL (ref 0.40–1.50)
GFR: 88.91 mL/min (ref >60.00)
Glucose, Bld: 116 mg/dL — ABNORMAL HIGH (ref 70–99)
Potassium: 4.1 mEq/L (ref 3.5–5.1)
Sodium: 140 mEq/L (ref 135–145)

## 2021-10-17 LAB — HEMOGLOBIN A1C: Hgb A1c MFr Bld: 5.9 % (ref 4.6–6.5)

## 2021-10-17 LAB — TSH: TSH: 2.32 u[IU]/mL (ref 0.35–5.50)

## 2021-10-19 ENCOUNTER — Ambulatory Visit: Payer: BC Managed Care – PPO | Admitting: Internal Medicine

## 2021-10-23 ENCOUNTER — Ambulatory Visit (INDEPENDENT_AMBULATORY_CARE_PROVIDER_SITE_OTHER): Payer: BC Managed Care – PPO | Admitting: Internal Medicine

## 2021-10-23 DIAGNOSIS — E7800 Pure hypercholesterolemia, unspecified: Secondary | ICD-10-CM

## 2021-10-23 DIAGNOSIS — K219 Gastro-esophageal reflux disease without esophagitis: Secondary | ICD-10-CM | POA: Diagnosis not present

## 2021-10-23 DIAGNOSIS — Z8546 Personal history of malignant neoplasm of prostate: Secondary | ICD-10-CM

## 2021-10-23 DIAGNOSIS — R739 Hyperglycemia, unspecified: Secondary | ICD-10-CM

## 2021-10-23 DIAGNOSIS — Z1211 Encounter for screening for malignant neoplasm of colon: Secondary | ICD-10-CM

## 2021-10-23 NOTE — Progress Notes (Unsigned)
Patient ID: Ramiro Harvest., male   DOB: 1955-12-28, 66 y.o.   MRN: [id number]   Subjective:    Patient ID: Ramiro Harvest., male    DOB: 1955-06-16, 66 y.o.   MRN: [id number]   Patient here for  Chief Complaint  Patient presents with   Follow-up    3 month follow up   .   HPI Here to follow up regarding his cholesterol and GERD. Was seen UC  for back pain.  Thoracic spine xray negative.  Was given prednisone and flexeril.  Back pain has resolved now. No residual problems.  Discussed labs. Cholesterol increased.  He stopped taking crestor.  Felt was contributing to acid reflux.  No acid reflux now.  Has been off for two months.  Discussed restarting just a couple of days per week and monitoring for symptom return.  No chest pain or sob.  No abdominal pain.  Bowels moving.  Planning to do his colonoscopy after first of the year.     Past Medical History:  Diagnosis Date   Allergy    Glaucoma    Hyperlipidemia    Prostate CA [facility])    Past Surgical History:  Procedure Laterality Date   BACK SURGERY  03/02/04   GLAUCOMA SURGERY Right 2012   Family History  Problem Relation Age of Onset   Hyperlipidemia Mother    Prostate cancer Father    Bone cancer Father    Social History   Socioeconomic History   Marital status: Married    Spouse name: Not on file   Number of children: Not on file   Years of education: Not on file   Highest education level: Not on file  Occupational History   Not on file  Tobacco Use   Smoking status: Never   Smokeless tobacco: Never  Vaping Use   Vaping Use: Never used  Substance and Sexual Activity   Alcohol use: No    Alcohol/week: 0.0 standard drinks of alcohol   Drug use: No   Sexual activity: Not on file  Other Topics Concern   Not on file  Social History Narrative   Married with kids   Social Determinants of Health   Financial Resource Strain: Not on file  Food Insecurity: Not on file  Transportation Needs: Not on  file  Physical Activity: Not on file  Stress: Not on file  Social Connections: Not on file     Review of Systems  Constitutional:  Negative for appetite change and unexpected weight change.  HENT:  Negative for congestion and sinus pressure.   Respiratory:  Negative for cough, chest tightness and shortness of breath.   Cardiovascular:  Negative for chest pain, palpitations and leg swelling.  Gastrointestinal:  Negative for abdominal pain, diarrhea, nausea and vomiting.  Genitourinary:  Negative for difficulty urinating and dysuria.  Musculoskeletal:  Negative for joint swelling and myalgias.  Skin:  Negative for color change and rash.  Neurological:  Negative for dizziness, light-headedness and headaches.  Psychiatric/Behavioral:  Negative for agitation and dysphoric mood.        Objective:     BP 120/70 (BP Location: Left Arm, Patient Position: Sitting, Cuff Size: Normal)   Pulse (!) 55   Temp 98 F (36.7 C) (Oral)   Ht '5\' 8"'$  (1.727 m)   Wt 200 lb 6.4 oz (90.9 kg)   SpO2 98%   BMI 30.47 kg/m  Wt Readings from Last 3 Encounters:  10/23/21 200 lb 6.4 oz (  90.9 kg)  07/17/21 197 lb 9.6 oz (89.6 kg)  05/22/21 196 lb (88.9 kg)    Physical Exam Constitutional:      General: He is not in acute distress.    Appearance: Normal appearance. He is well-developed.  HENT:     Head: Normocephalic and atraumatic.     Right Ear: External ear normal.     Left Ear: External ear normal.  Eyes:     General: No scleral icterus.       Right eye: No discharge.        Left eye: No discharge.  Cardiovascular:     Rate and Rhythm: Normal rate and regular rhythm.  Pulmonary:     Effort: Pulmonary effort is normal. No respiratory distress.     Breath sounds: Normal breath sounds.  Abdominal:     General: Bowel sounds are normal.     Palpations: Abdomen is soft.     Tenderness: There is no abdominal tenderness.  Musculoskeletal:        General: No swelling or tenderness.     Cervical  back: Neck supple. No tenderness.  Lymphadenopathy:     Cervical: No cervical adenopathy.  Skin:    Findings: No erythema or rash.  Neurological:     Mental Status: He is alert.  Psychiatric:        Mood and Affect: Mood normal.        Behavior: Behavior normal.      Outpatient Encounter Medications as of 10/23/2021  Medication Sig   cyclobenzaprine (FLEXERIL) 10 MG tablet Take 1 tablet (10 mg total) by mouth at bedtime.   dorzolamide-timolol (COSOPT) 22.3-6.8 MG/ML ophthalmic solution    fexofenadine (ALLEGRA) 180 MG tablet Take 180 mg by mouth daily.   fluticasone (FLONASE) 50 MCG/ACT nasal spray Place 2 sprays into both nostrils daily. In each nostril   latanoprost (XALATAN) 0.005 % ophthalmic solution 1 drop at bedtime.   predniSONE (DELTASONE) 20 MG tablet Take 2 tablets (40 mg total) by mouth daily.   rosuvastatin (CRESTOR) 10 MG tablet TAKE 1 TABLET DAILY   [DISCONTINUED] methocarbamol (ROBAXIN) 500 MG tablet Take 1 tablet (500 mg total) by mouth 2 (two) times daily as needed for muscle spasms.   No facility-administered encounter medications on file as of 10/23/2021.     Lab Results  Component Value Date   WBC 4.9 10/17/2021   HGB 13.7 10/17/2021   HCT 39.8 10/17/2021   PLT 182.0 10/17/2021   GLUCOSE 116 (H) 10/17/2021   CHOL 162 10/17/2021   TRIG 70.0 10/17/2021   HDL 45.60 10/17/2021   LDLCALC 103 (H) 10/17/2021   ALT 17 10/17/2021   AST 17 10/17/2021   NA 140 10/17/2021   K 4.1 10/17/2021   CL 103 10/17/2021   CREATININE 0.91 10/17/2021   BUN 15 10/17/2021   CO2 29 10/17/2021   TSH 2.32 10/17/2021   PSA 1.55 06/05/2021   INR 1.0 12/19/2012   HGBA1C 5.9 10/17/2021    DG Thoracic Spine 2 View  Result Date: 08/14/2021 CLINICAL DATA:  Upper back pain for 3 weeks EXAM: THORACIC SPINE 2 VIEWS COMPARISON:  None Available. FINDINGS: There is no evidence of thoracic spine fracture. Minimal S shaped curvature of the thoracolumbar spine. Alignment is otherwise  normal. Mild degenerative changes with small anterior osteophyte formation. No other significant bone abnormalities are identified. IMPRESSION: Negative. Electronically Signed   By: Merilyn Baba M.D.   On: 08/14/2021 18:52  Assessment & Plan:   Problem List Items Addressed This Visit   None    Einar Pheasant, MD

## 2021-10-24 ENCOUNTER — Telehealth: Payer: Self-pay | Admitting: Internal Medicine

## 2021-10-24 ENCOUNTER — Encounter: Payer: Self-pay | Admitting: Internal Medicine

## 2021-10-24 MED ORDER — ROSUVASTATIN CALCIUM 10 MG PO TABS: ORAL_TABLET | ORAL | 1 refills | Status: DC

## 2021-10-24 NOTE — Telephone Encounter (Signed)
LMTCB

## 2021-10-24 NOTE — Assessment & Plan Note (Signed)
Off crestor as outlined.  Discussed restarting.  Will start '10mg'$  2x/week. Follow for any change in symptoms.  Low cholesterol diet and exercise.  Follow lipid panel and liver function tests.

## 2021-10-24 NOTE — Assessment & Plan Note (Signed)
Felt like symptoms were from crestor.  Not having symptoms currently.  Not requiring pepcid or PPI.  Follow with restarting crestor.

## 2021-10-24 NOTE — Assessment & Plan Note (Signed)
Low carb diet and exercise.  Follow met b and a1c.  

## 2021-10-24 NOTE — Telephone Encounter (Signed)
Discussed during his office visit restarting crestor.  Notify him that given restarting, I would like for him to come in for a non fasting lab (to check his liver panel) in 6 weeks.  Orders are in.  Just needs non fasting lab appt scheduled.

## 2021-10-24 NOTE — Assessment & Plan Note (Signed)
Followed by urology.  Stable per report.  

## 2021-10-24 NOTE — Assessment & Plan Note (Signed)
Discussed f/u colonoscopy.  Agreeable.  Wants to wait until after first of the year.  Will notify me when agreeable.

## 2021-10-30 NOTE — Telephone Encounter (Signed)
Pt scheduled for labs 02/21/22

## 2021-12-12 DIAGNOSIS — X32XXXA Exposure to sunlight, initial encounter: Secondary | ICD-10-CM | POA: Diagnosis not present

## 2021-12-12 DIAGNOSIS — D2262 Melanocytic nevi of left upper limb, including shoulder: Secondary | ICD-10-CM | POA: Diagnosis not present

## 2021-12-12 DIAGNOSIS — Z85828 Personal history of other malignant neoplasm of skin: Secondary | ICD-10-CM | POA: Diagnosis not present

## 2021-12-12 DIAGNOSIS — L570 Actinic keratosis: Secondary | ICD-10-CM | POA: Diagnosis not present

## 2021-12-12 DIAGNOSIS — D225 Melanocytic nevi of trunk: Secondary | ICD-10-CM | POA: Diagnosis not present

## 2021-12-12 DIAGNOSIS — L821 Other seborrheic keratosis: Secondary | ICD-10-CM | POA: Diagnosis not present

## 2022-02-14 ENCOUNTER — Other Ambulatory Visit: Payer: Self-pay | Admitting: Internal Medicine

## 2022-02-21 ENCOUNTER — Other Ambulatory Visit (INDEPENDENT_AMBULATORY_CARE_PROVIDER_SITE_OTHER): Payer: BC Managed Care – PPO

## 2022-02-21 ENCOUNTER — Other Ambulatory Visit: Payer: Self-pay | Admitting: Internal Medicine

## 2022-02-21 DIAGNOSIS — E7800 Pure hypercholesterolemia, unspecified: Secondary | ICD-10-CM

## 2022-02-21 LAB — HEPATIC FUNCTION PANEL
ALT: 20 U/L (ref 0–53)
AST: 17 U/L (ref 0–37)
Albumin: 4.3 g/dL (ref 3.5–5.2)
Alkaline Phosphatase: 52 U/L (ref 39–117)
Bilirubin, Direct: 0.1 mg/dL (ref 0.0–0.3)
Total Bilirubin: 0.6 mg/dL (ref 0.2–1.2)
Total Protein: 6.9 g/dL (ref 6.0–8.3)

## 2022-02-22 ENCOUNTER — Other Ambulatory Visit (INDEPENDENT_AMBULATORY_CARE_PROVIDER_SITE_OTHER): Payer: BC Managed Care – PPO

## 2022-02-22 ENCOUNTER — Telehealth: Payer: Self-pay | Admitting: Internal Medicine

## 2022-02-22 DIAGNOSIS — E7800 Pure hypercholesterolemia, unspecified: Secondary | ICD-10-CM

## 2022-02-22 LAB — BASIC METABOLIC PANEL
BUN: 15 mg/dL (ref 6–23)
CO2: 27 mEq/L (ref 19–32)
Calcium: 9.1 mg/dL (ref 8.4–10.5)
Chloride: 103 mEq/L (ref 96–112)
Creatinine, Ser: 0.89 mg/dL (ref 0.40–1.50)
GFR: 90.18 mL/min (ref >60.00)
Glucose, Bld: 123 mg/dL — ABNORMAL HIGH (ref 70–99)
Potassium: 4.4 mEq/L (ref 3.5–5.1)
Sodium: 141 mEq/L (ref 135–145)

## 2022-02-22 LAB — LIPID PANEL
Cholesterol: 126 mg/dL (ref 0–200)
HDL: 51.4 mg/dL (ref >39.00)
LDL Cholesterol: 61 mg/dL (ref 0–99)
NonHDL: 74.97
Total CHOL/HDL Ratio: 2
Triglycerides: 68 mg/dL (ref 0.0–149.0)
VLDL: 13.6 mg/dL (ref 0.0–40.0)

## 2022-02-22 NOTE — Telephone Encounter (Signed)
Returned wifes call. Nothing further needed.

## 2022-02-22 NOTE — Telephone Encounter (Signed)
Patient's wife returned office phone, please call wife.

## 2022-02-23 ENCOUNTER — Ambulatory Visit (INDEPENDENT_AMBULATORY_CARE_PROVIDER_SITE_OTHER): Payer: BC Managed Care – PPO | Admitting: Internal Medicine

## 2022-02-23 VITALS — BP 132/70 | HR 65 | Temp 98.0°F | Resp 16 | Ht 68.0 in | Wt 195.4 lb

## 2022-02-23 DIAGNOSIS — R739 Hyperglycemia, unspecified: Secondary | ICD-10-CM

## 2022-02-23 DIAGNOSIS — Z1211 Encounter for screening for malignant neoplasm of colon: Secondary | ICD-10-CM

## 2022-02-23 DIAGNOSIS — E7800 Pure hypercholesterolemia, unspecified: Secondary | ICD-10-CM

## 2022-02-23 DIAGNOSIS — Z0000 Encounter for general adult medical examination without abnormal findings: Secondary | ICD-10-CM

## 2022-02-23 DIAGNOSIS — Z8546 Personal history of malignant neoplasm of prostate: Secondary | ICD-10-CM

## 2022-02-23 DIAGNOSIS — K219 Gastro-esophageal reflux disease without esophagitis: Secondary | ICD-10-CM

## 2022-02-23 LAB — POCT GLYCOSYLATED HEMOGLOBIN (HGB A1C): Hemoglobin A1C: 5.5 % (ref 4.0–5.6)

## 2022-02-23 NOTE — Assessment & Plan Note (Signed)
Physical today 02/23/22.  Urology following - history of prostate cancer.  Colonoscopy 01/2012.  States has in [place].  Need to see if can determine f/u recs.

## 2022-02-23 NOTE — Progress Notes (Signed)
Subjective:    [name]., male    DOB: 1955/08/23, 67 y.o.   MRN: [id number]  Patient here for  Chief Complaint  Patient presents with   Annual Exam    HPI Here for a physical exam. Reports he is doing relatively well.  Works a lot.  Planning to retire at the end of the year.  No chest pain or sob reported.  No abdominal pain or bowel change reported.  Previously stopped cholesterol medicine. Felt increased acid reflux.  Restarted - a few days per week. Tolerating.  Discussed labs.  Cholesterol improved. Colonoscopy - had wanted to wait until after first of the year. Discussed.  Wants to wait a little longer.     Past Medical History:  Diagnosis Date   Allergy    Glaucoma    Hyperlipidemia    Prostate CA [facility])    Past Surgical History:  Procedure Laterality Date   BACK SURGERY  03/02/04   GLAUCOMA SURGERY Right 2012   Family History  Problem Relation Age of Onset   Hyperlipidemia Mother    Prostate cancer Father    Bone cancer Father    Social History   Socioeconomic History   Marital status: Married    Spouse name: Not on file   Number of children: Not on file   Years of education: Not on file   Highest education level: Not on file  Occupational History   Not on file  Tobacco Use   Smoking status: Never   Smokeless tobacco: Never  Vaping Use   Vaping Use: Never used  Substance and Sexual Activity   Alcohol use: No    Alcohol/week: 0.0 standard drinks of alcohol   Drug use: No   Sexual activity: Not on file  Other Topics Concern   Not on file  Social History Narrative   Married with kids   Social Determinants of Health   Financial Resource Strain: Not on file  Food Insecurity: Not on file  Transportation Needs: Not on file  Physical Activity: Not on file  Stress: Not on file  Social Connections: Not on file     Review of Systems  Constitutional:  Negative for appetite change and unexpected weight change.  HENT:   Negative for congestion, sinus pressure and sore throat.   Eyes:  Negative for pain and visual disturbance.  Respiratory:  Negative for cough, chest tightness and shortness of breath.   Cardiovascular:  Negative for chest pain, palpitations and leg swelling.  Gastrointestinal:  Negative for abdominal pain, diarrhea, nausea and vomiting.  Genitourinary:  Negative for difficulty urinating and dysuria.  Musculoskeletal:  Negative for joint swelling and myalgias.  Skin:  Negative for color change and rash.  Neurological:  Negative for dizziness and headaches.  Hematological:  Negative for adenopathy. Does not bruise/bleed easily.  Psychiatric/Behavioral:  Negative for agitation and dysphoric mood.        Objective:     BP 132/70   Pulse 65   Temp 98 F (36.7 C)   Resp 16   Ht 5' 8"$  (1.727 m)   Wt 195 lb 6.4 oz (88.6 kg)   SpO2 99%   BMI 29.71 kg/m  Wt Readings from Last 3 Encounters:  02/23/22 195 lb 6.4 oz (88.6 kg)  10/23/21 200 lb 6.4 oz (90.9 kg)  07/17/21 197 lb 9.6 oz (89.6 kg)    Physical Exam Constitutional:      General: He is not in acute  distress.    Appearance: Normal appearance. He is well-developed.  HENT:     Head: Normocephalic and atraumatic.     Right Ear: External ear normal.     Left Ear: External ear normal.  Eyes:     General: No scleral icterus.       Right eye: No discharge.        Left eye: No discharge.     Conjunctiva/sclera: Conjunctivae normal.  Neck:     Thyroid: No thyromegaly.  Cardiovascular:     Rate and Rhythm: Normal rate and regular rhythm.  Pulmonary:     Effort: No respiratory distress.     Breath sounds: Normal breath sounds. No wheezing.  Abdominal:     General: Bowel sounds are normal.     Palpations: Abdomen is soft.     Tenderness: There is no abdominal tenderness.  Musculoskeletal:        General: No swelling or tenderness.     Cervical back: Neck supple. No tenderness.  Lymphadenopathy:     Cervical: No cervical  adenopathy.  Skin:    Findings: No erythema or rash.  Neurological:     Mental Status: He is alert and oriented to person, place, and time.  Psychiatric:        Mood and Affect: Mood normal.        Behavior: Behavior normal.      Outpatient Encounter Medications as of 02/23/2022  Medication Sig   cyclobenzaprine (FLEXERIL) 10 MG tablet Take 1 tablet (10 mg total) by mouth at bedtime.   dorzolamide-timolol (COSOPT) 22.3-6.8 MG/ML ophthalmic solution    fexofenadine (ALLEGRA) 180 MG tablet Take 180 mg by mouth daily.   fluticasone (FLONASE) 50 MCG/ACT nasal spray Place 2 sprays into both nostrils daily. In each nostril   latanoprost (XALATAN) 0.005 % ophthalmic solution 1 drop at bedtime.   predniSONE (DELTASONE) 20 MG tablet Take 2 tablets (40 mg total) by mouth daily.   rosuvastatin (CRESTOR) 10 MG tablet TAKE 1 TABLET DAILY   No facility-administered encounter medications on file as of 02/23/2022.     Lab Results  Component Value Date   WBC 4.9 10/17/2021   HGB 13.7 10/17/2021   HCT 39.8 10/17/2021   PLT 182.0 10/17/2021   GLUCOSE 123 (H) 02/22/2022   CHOL 126 02/22/2022   TRIG 68.0 02/22/2022   HDL 51.40 02/22/2022   LDLCALC 61 02/22/2022   ALT 20 02/21/2022   AST 17 02/21/2022   NA 141 02/22/2022   K 4.4 02/22/2022   CL 103 02/22/2022   CREATININE 0.89 02/22/2022   BUN 15 02/22/2022   CO2 27 02/22/2022   TSH 2.32 10/17/2021   PSA 1.55 06/05/2021   INR 1.0 12/19/2012   HGBA1C 5.5 02/23/2022    DG Thoracic Spine 2 View  Result Date: 08/14/2021 CLINICAL DATA:  Upper back pain for 3 weeks EXAM: THORACIC SPINE 2 VIEWS COMPARISON:  None Available. FINDINGS: There is no evidence of thoracic spine fracture. Minimal S shaped curvature of the thoracolumbar spine. Alignment is otherwise normal. Mild degenerative changes with small anterior osteophyte formation. No other significant bone abnormalities are identified. IMPRESSION: Negative. Electronically Signed   By: Merilyn Baba M.D.   On: 08/14/2021 18:52       Assessment & Plan:  Routine general medical examination at a health care facility  Hyperglycemia Assessment & Plan: Low carb diet and exercise.  Follow met b and a1c. A1c 5.5.   Orders: -  POCT glycosylated hemoglobin (Hb A1C) -     Hemoglobin A1c; Future  Health care maintenance Assessment & Plan: Physical today 02/23/22.  Urology following - history of prostate cancer.  Colonoscopy 01/2012.  Discussed due f/u.  Will notify me when agreeable.    Colon cancer screening Assessment & Plan: Discussed f/u colonoscopy.  Wants to wait a little longer. Will notify me when agreeable.     Gastroesophageal reflux disease, unspecified whether esophagitis present Assessment & Plan: Back on crestor a few days per week. Tolerating.  No increased acid reflux.  Follow.    History of prostate cancer Assessment & Plan: Followed by urology.  Stable per report.    Hypercholesterolemia Assessment & Plan: Tolerating crestor at current dosing regimen.  Low cholesterol diet and exercise.  Follow lipid panel and liver function tests.   Lab Results  Component Value Date   CHOL 126 02/22/2022   HDL 51.40 02/22/2022   LDLCALC 61 02/22/2022   TRIG 68.0 02/22/2022   CHOLHDL 2 02/22/2022     Orders: -     Basic metabolic panel; Future -     Lipid panel; Future -     Hepatic function panel; Future     Einar Pheasant, MD

## 2022-02-25 ENCOUNTER — Encounter: Payer: Self-pay | Admitting: Internal Medicine

## 2022-02-25 NOTE — Assessment & Plan Note (Signed)
Back on crestor a few days per week. Tolerating.  No increased acid reflux.  Follow.

## 2022-02-25 NOTE — Assessment & Plan Note (Signed)
Tolerating crestor at current dosing regimen.  Low cholesterol diet and exercise.  Follow lipid panel and liver function tests.   Lab Results  Component Value Date   CHOL 126 02/22/2022   HDL 51.40 02/22/2022   LDLCALC 61 02/22/2022   TRIG 68.0 02/22/2022   CHOLHDL 2 02/22/2022

## 2022-02-25 NOTE — Assessment & Plan Note (Signed)
Low carb diet and exercise.  Follow met b and a1c. A1c 5.5.

## 2022-02-25 NOTE — Assessment & Plan Note (Addendum)
Discussed f/u colonoscopy.  Wants to wait a little longer. Will notify me when agreeable.

## 2022-02-25 NOTE — Assessment & Plan Note (Signed)
Followed by urology.  Stable per report.

## 2022-03-05 ENCOUNTER — Encounter: Payer: Self-pay | Admitting: Urology

## 2022-03-05 ENCOUNTER — Ambulatory Visit (INDEPENDENT_AMBULATORY_CARE_PROVIDER_SITE_OTHER): Payer: BC Managed Care – PPO | Admitting: Urology

## 2022-03-05 VITALS — BP 173/78 | HR 58 | Ht 68.0 in | Wt 199.0 lb

## 2022-03-05 DIAGNOSIS — N400 Enlarged prostate without lower urinary tract symptoms: Secondary | ICD-10-CM

## 2022-03-05 DIAGNOSIS — C61 Malignant neoplasm of prostate: Secondary | ICD-10-CM | POA: Diagnosis not present

## 2022-03-05 NOTE — Progress Notes (Signed)
   03/05/2022 2:59 PM   Nelda Bucks [birth year] [id number]  Referring provider: Einar Pheasant, [id number] [place],  [place] [address]  Chief Complaint  Patient presents with   Other    Urologic history: 1.  Prostate cancer Intermediate risk prostate cancer diagnosed 2017 (Gleason 3+4) Treated HIFU right hemi-ablation Left-sided disease diagnosed 2019 with rising PSA and underwent left hemi-ablation    HPI: Jeremy Fischer is a 67 y.o. male previously followed by Dr. Yves Dill for the above problem list.  He presents to transfer urologic care with Dr. Letta Kocher recent retirement.  He last saw Dr. Yves Dill 08/07/2021; PSA was stable at 1.9 No bothersome LUTS Denies dysuria, gross hematuria No flank, abdominal or pelvic pain   PMH: Past Medical History:  Diagnosis Date   Allergy    Glaucoma    Hyperlipidemia    Prostate CA [facility])     Surgical History: Past Surgical History:  Procedure Laterality Date   BACK SURGERY  03/02/04   GLAUCOMA SURGERY Right 2012    Home Medications:  Allergies as of 03/05/2022       Reactions   Contrast Media [iodinated Contrast Media] Rash        Medication List        Accurate as of March 05, 2022  2:59 PM. If you have any questions, ask your nurse or doctor.          cyclobenzaprine 10 MG tablet Commonly known as: FLEXERIL Take 1 tablet (10 mg total) by mouth at bedtime.   dorzolamide-timolol 2-0.5 % ophthalmic solution Commonly known as: COSOPT   fexofenadine 180 MG tablet Commonly known as: ALLEGRA Take 180 mg by mouth daily.   fluticasone 50 MCG/ACT nasal spray Commonly known as: FLONASE Place 2 sprays into both nostrils daily. In each nostril   latanoprost 0.005 % ophthalmic solution Commonly known as: XALATAN 1 drop at bedtime.   predniSONE 20 MG tablet Commonly known as: DELTASONE Take 2 tablets (40 mg total) by mouth daily.   rosuvastatin 10 MG tablet Commonly known  as: CRESTOR TAKE 1 TABLET DAILY        Allergies:  Allergies  Allergen Reactions   Contrast Media [Iodinated Contrast Media] Rash    Family History: Family History  Problem Relation Age of Onset   Hyperlipidemia Mother    Prostate cancer Father    Bone cancer Father     Social History:  reports that he has never smoked. He has never used smokeless tobacco. He reports that he does not drink alcohol and does not use drugs.   Physical Exam: BP (!) 173/78   Pulse (!) 58   Ht 5' 8"$  (1.727 m)   Wt 199 lb (90.3 kg)   BMI 30.26 kg/m   Constitutional:  Alert, No acute distress. HEENT: [place] AT Respiratory: Normal respiratory effort, no increased work of breathing. Psychiatric: Normal mood and affect.   Assessment & Plan:    1.  Intermediate risk prostate cancer Treated with HIFU 2017 and 2019 PSA drawn today and is stable he will follow-up in 1 year with    Abbie Sons, MD  [place] [address], [place],  [address] [phone]

## 2022-03-06 ENCOUNTER — Encounter: Payer: Self-pay | Admitting: Urology

## 2022-03-06 LAB — PSA: Prostate Specific Ag, Serum: 2.2 ng/mL (ref 0.0–4.0)

## 2022-03-07 ENCOUNTER — Telehealth: Payer: Self-pay | Admitting: *Deleted

## 2022-03-07 ENCOUNTER — Encounter: Payer: Self-pay | Admitting: *Deleted

## 2022-03-07 NOTE — Telephone Encounter (Signed)
-----   Message from Abbie Sons, MD sent at 03/07/2022  9:54 AM EST ----- PSA slightly higher at 2.2.  He is scheduled for 1 year follow-up.  Since PSA slightly increased please schedule lab visit for repeat PSA in 6 months

## 2022-03-07 NOTE — Telephone Encounter (Signed)
Left message to see my chart

## 2022-04-02 ENCOUNTER — Ambulatory Visit (INDEPENDENT_AMBULATORY_CARE_PROVIDER_SITE_OTHER): Payer: BC Managed Care – PPO

## 2022-04-02 ENCOUNTER — Ambulatory Visit
Admission: EM | Admit: 2022-04-02 | Discharge: 2022-04-02 | Disposition: A | Discharge status: Home / Self Care | Payer: BC Managed Care – PPO | Attending: Emergency Medicine | Admitting: Emergency Medicine

## 2022-04-02 DIAGNOSIS — M25521 Pain in right elbow: Secondary | ICD-10-CM

## 2022-04-02 DIAGNOSIS — M7021 Olecranon bursitis, right elbow: Secondary | ICD-10-CM | POA: Diagnosis not present

## 2022-04-02 DIAGNOSIS — M25421 Effusion, right elbow: Secondary | ICD-10-CM

## 2022-04-02 NOTE — ED Triage Notes (Signed)
Patient to Urgent Care with complaints of right sided elbow pain and swelling.   Reports he started to have some pain last night, this afternoon started noticed swelling. Denies any known injury. Full range of motion.

## 2022-04-02 NOTE — ED Provider Notes (Signed)
Jeremy Fischer    CSN: [id number] Arrival date & time: 04/02/22  1431      History   Chief Complaint Chief Complaint  Patient presents with   Joint Swelling    HPI Jeremy Fischer is a 67 y.o. male.  Patient presents with pain and swelling of his right elbow today.  No falls or injury.  No fever, chills, redness, wounds, bruising, numbness, weakness, or other symptoms.  No treatment at home.    The history is provided by the patient and medical records.    Past Medical History:  Diagnosis Date   Allergy    Glaucoma    Hyperlipidemia    Prostate CA [facility])     Patient Active Problem List   Diagnosis Date Noted   GERD (gastroesophageal reflux disease) 05/28/2021   Colon cancer screening 02/21/2020   Hypercholesterolemia 04/01/2018   Acute non-recurrent sinusitis 05/01/2017   History of prostate cancer 04/24/2015   Pre-op evaluation 04/24/2015   Health care maintenance 10/24/2014   Rash 10/24/2014   Elevated blood pressure reading 10/18/2013   Abnormal CT scan, neck 05/10/2013   Hyperglycemia 05/10/2013   Benign prostatic hyperplasia 10/22/2012   Glaucoma 10/15/2012   Environmental allergies 10/15/2012    Past Surgical History:  Procedure Laterality Date   BACK SURGERY  03/02/04   GLAUCOMA SURGERY Right 2012       Home Medications    Prior to Admission medications   Medication Sig Start Date End Date Taking? Authorizing Provider  cyclobenzaprine (FLEXERIL) 10 MG tablet Take 1 tablet (10 mg total) by mouth at bedtime. Patient not taking: Reported on 04/02/2022 08/14/21   Hans Eden, NP  dorzolamide-timolol (COSOPT) 22.3-6.8 MG/ML ophthalmic solution  11/03/12   [provider]  fexofenadine (ALLEGRA) 180 MG tablet Take 180 mg by mouth daily.    [provider]  fluticasone (FLONASE) 50 MCG/ACT nasal spray Place 2 sprays into both nostrils daily. In each nostril 05/01/17   Venia Carbon, MD  latanoprost (XALATAN) 0.005 %  ophthalmic solution 1 drop at bedtime. 04/07/21   [provider]  predniSONE (DELTASONE) 20 MG tablet Take 2 tablets (40 mg total) by mouth daily. Patient not taking: Reported on 04/02/2022 08/14/21   Hans Eden, NP  rosuvastatin (CRESTOR) 10 MG tablet TAKE 1 TABLET DAILY 02/14/22   Einar Pheasant, MD    Family History Family History  Problem Relation Age of Onset   Hyperlipidemia Mother    Prostate cancer Father    Bone cancer Father     Social History Social History   Tobacco Use   Smoking status: Never   Smokeless tobacco: Never  Vaping Use   Vaping Use: Never used  Substance Use Topics   Alcohol use: No    Alcohol/week: 0.0 standard drinks of alcohol   Drug use: No     Allergies   Contrast media [iodinated contrast media]   Review of Systems Review of Systems  Constitutional:  Negative for chills and fever.  Musculoskeletal:  Positive for arthralgias and joint swelling.  Skin:  Negative for color change, rash and wound.  Neurological:  Negative for weakness and numbness.  All other systems reviewed and are negative.    Physical Exam Triage Vital Signs ED Triage Vitals  Enc Vitals Group     BP 04/02/22 1447 138/72     Pulse Rate 04/02/22 1447 (!) 52     Resp 04/02/22 1447 18     Temp 04/02/22 1447  97.9 F (36.6 C)     Temp src --      SpO2 04/02/22 1447 98 %     Weight --      Height --      Head Circumference --      Peak Flow --      Pain Score 04/02/22 1445 3     Pain Loc --      Pain Edu? --      Excl. in [place]? --    No data found.  Updated Vital Signs BP 138/72   Pulse (!) 52   Temp 97.9 F (36.6 C)   Resp 18   SpO2 98%   Visual Acuity Right Eye Distance:   Left Eye Distance:   Bilateral Distance:    Right Eye Near:   Left Eye Near:    Bilateral Near:     Physical Exam Vitals and nursing note reviewed.  Constitutional:      General: He is not in acute distress.    Appearance: Normal appearance. He is  well-developed. He is not ill-appearing.  HENT:     Mouth/Throat:     Mouth: Mucous membranes are moist.  Cardiovascular:     Rate and Rhythm: Normal rate and regular rhythm.     Heart sounds: Normal heart sounds.  Pulmonary:     Effort: Pulmonary effort is normal. No respiratory distress.     Breath sounds: Normal breath sounds.  Musculoskeletal:        General: Swelling present. No tenderness or deformity. Normal range of motion.       Arms:     Cervical back: Neck supple.     Comments: RUE: 2+ pulses, strength 5/5, sensation intact.   Skin:    General: Skin is warm and dry.     Capillary Refill: Capillary refill takes less than 2 seconds.     Findings: No bruising, erythema, lesion or rash.  Neurological:     General: No focal deficit present.     Mental Status: He is alert and oriented to person, place, and time.     Sensory: No sensory deficit.     Motor: No weakness.  Psychiatric:        Mood and Affect: Mood normal.        Behavior: Behavior normal.      UC Treatments / Results  Labs (all labs ordered are listed, but only abnormal results are displayed) Labs Reviewed - No data to display  EKG   Radiology DG Elbow Complete Right  Result Date: 04/02/2022 CLINICAL DATA:  Right elbow pain and swelling, no known injury EXAM: RIGHT ELBOW - COMPLETE 3+ VIEW COMPARISON:  None Available. FINDINGS: Osseous mineralization low normal. Joint spaces preserved. No acute fracture, dislocation, or bone destruction. No joint effusion. Soft tissue swelling overlying olecranon. IMPRESSION: No osseous abnormalities. Soft tissue swelling dorsally overlying the olecranon, could represent edema or olecranon bursitis. Electronically Signed   By: Lavonia Dana M.D.   On: 04/02/2022 15:30    Procedures Procedures (including critical care time)  Medications Ordered in UC Medications - No data to display  Initial Impression / Assessment and Plan / UC Course  I have reviewed the triage  vital signs and the nursing notes.  Pertinent labs & imaging results that were available during my care of the patient were reviewed by me and considered in my medical decision making (see chart for details).    Olecranon bursitis.  Xray shows no bony abnormality.  Treating with ace wrap and sling.  Discussed rest, elevation, ibuprofen.  Education provided on elbow bursitis.  Instructed patient to follow-up with orthopedics.  Contact information for on-call Ortho provided.  Patient agrees to plan of care.   Final Clinical Impressions(s) / UC Diagnoses   Final diagnoses:  Pain and swelling of right elbow  Olecranon bursitis of right elbow     Discharge Instructions      Wear the ace wrap and sling as directed.  Take ibuprofen as directed.  Follow up with an orthopedist such as the one listed below.       ED Prescriptions   None    I have reviewed the PDMP during this encounter.   Sharion Balloon, NP 04/02/22 1540

## 2022-04-02 NOTE — Discharge Instructions (Addendum)
Wear the ace wrap and sling as directed.  Take ibuprofen as directed.  Follow up with an orthopedist such as the one listed below.

## 2022-04-05 DIAGNOSIS — M7021 Olecranon bursitis, right elbow: Secondary | ICD-10-CM | POA: Diagnosis not present

## 2022-04-20 DIAGNOSIS — E785 Hyperlipidemia, unspecified: Secondary | ICD-10-CM | POA: Diagnosis not present

## 2022-04-20 DIAGNOSIS — K219 Gastro-esophageal reflux disease without esophagitis: Secondary | ICD-10-CM | POA: Diagnosis not present

## 2022-04-20 DIAGNOSIS — Z79899 Other long term (current) drug therapy: Secondary | ICD-10-CM | POA: Diagnosis not present

## 2022-04-20 DIAGNOSIS — R10819 Abdominal tenderness, unspecified site: Secondary | ICD-10-CM | POA: Diagnosis not present

## 2022-04-20 DIAGNOSIS — Z91041 Radiographic dye allergy status: Secondary | ICD-10-CM | POA: Diagnosis not present

## 2022-04-20 DIAGNOSIS — R109 Unspecified abdominal pain: Secondary | ICD-10-CM | POA: Diagnosis not present

## 2022-04-20 DIAGNOSIS — Z8546 Personal history of malignant neoplasm of prostate: Secondary | ICD-10-CM | POA: Diagnosis not present

## 2022-04-21 DIAGNOSIS — R109 Unspecified abdominal pain: Secondary | ICD-10-CM | POA: Diagnosis not present

## 2022-04-23 DIAGNOSIS — H43813 Vitreous degeneration, bilateral: Secondary | ICD-10-CM | POA: Diagnosis not present

## 2022-04-23 DIAGNOSIS — H2513 Age-related nuclear cataract, bilateral: Secondary | ICD-10-CM | POA: Diagnosis not present

## 2022-04-23 DIAGNOSIS — H353131 Nonexudative age-related macular degeneration, bilateral, early dry stage: Secondary | ICD-10-CM | POA: Diagnosis not present

## 2022-04-23 DIAGNOSIS — H401133 Primary open-angle glaucoma, bilateral, severe stage: Secondary | ICD-10-CM | POA: Diagnosis not present

## 2022-04-26 ENCOUNTER — Encounter: Payer: Self-pay | Admitting: Nurse Practitioner

## 2022-04-26 ENCOUNTER — Ambulatory Visit (INDEPENDENT_AMBULATORY_CARE_PROVIDER_SITE_OTHER): Payer: BC Managed Care – PPO | Admitting: Nurse Practitioner

## 2022-04-26 ENCOUNTER — Telehealth: Payer: Self-pay | Admitting: Internal Medicine

## 2022-04-26 VITALS — BP 140/80 | HR 53 | Temp 98.1°F | Ht 68.0 in | Wt 196.8 lb

## 2022-04-26 DIAGNOSIS — R109 Unspecified abdominal pain: Secondary | ICD-10-CM | POA: Diagnosis not present

## 2022-04-26 LAB — POC URINALSYSI DIPSTICK (AUTOMATED)
Bilirubin, UA: NEGATIVE
Blood, UA: NEGATIVE
Glucose, UA: NEGATIVE
Ketones, UA: NEGATIVE
Leukocytes, UA: NEGATIVE
Nitrite, UA: NEGATIVE
Protein, UA: NEGATIVE
Spec Grav, UA: 1.02 (ref 1.010–1.025)
Urobilinogen, UA: 1 E.U./dL
pH, UA: 7 (ref 5.0–8.0)

## 2022-04-26 NOTE — Telephone Encounter (Signed)
Lft pt vm to call ofc to sch US. thanks ?

## 2022-04-26 NOTE — Progress Notes (Signed)
Bethanie Dicker, NP-C Phone: [phone]  Jeremy Fischer is a 67 y.o. male who presents today for ED follow up.   Patient was seen in the ED on 04/20/2022 for right flank pain. He had lab work and a CT scan that was all normal. He continues to have right lower quadrant/flank pain. He reports the pain has been ongoing for about a month. It is a sore/aching pain that become sharp with turning. There has been no change in his pain since last week when he was evaluated in the ED. He reports sitting still and Ibuprofen alleviates the pain. Rolling over in bed makes it worse. He has continued to work daily. Denies pain with bending. No known injury. Denies dysuria. Denies back pain. Denies nausea and vomiting. Denies fevers. Denies diarrhea and constipation.   Social History   Tobacco Use  Smoking Status Never  Smokeless Tobacco Never    Current Outpatient Medications on File Prior to Visit  Medication Sig Dispense Refill   cyclobenzaprine (FLEXERIL) 10 MG tablet Take 1 tablet (10 mg total) by mouth at bedtime. (Patient not taking: Reported on 04/02/2022) 10 tablet 0   dorzolamide-timolol (COSOPT) 22.3-6.8 MG/ML ophthalmic solution      fexofenadine (ALLEGRA) 180 MG tablet Take 180 mg by mouth daily.     fluticasone (FLONASE) 50 MCG/ACT nasal spray Place 2 sprays into both nostrils daily. In each nostril 16 g 12   latanoprost (XALATAN) 0.005 % ophthalmic solution 1 drop at bedtime.     predniSONE (DELTASONE) 20 MG tablet Take 2 tablets (40 mg total) by mouth daily. (Patient not taking: Reported on 04/02/2022) 10 tablet 0   rosuvastatin (CRESTOR) 10 MG tablet TAKE 1 TABLET DAILY 90 tablet 3   No current facility-administered medications on file prior to visit.    ROS see history of present illness  Objective  Physical Exam Vitals:   04/26/22 1505 04/26/22 1545  BP: (!) 140/64 (!) 140/80  Pulse: (!) 53   Temp: 98.1 F (36.7 C)   SpO2: 98%     BP Readings from Last 3 Encounters:   04/26/22 (!) 140/80  04/02/22 138/72  03/05/22 (!) 173/78   Wt Readings from Last 3 Encounters:  04/26/22 196 lb 12.8 oz (89.3 kg)  03/05/22 199 lb (90.3 kg)  02/23/22 195 lb 6.4 oz (88.6 kg)    Physical Exam Constitutional:      General: He is not in acute distress.    Appearance: Normal appearance.  HENT:     Head: Normocephalic.  Cardiovascular:     Rate and Rhythm: Normal rate and regular rhythm.     Heart sounds: Normal heart sounds.  Pulmonary:     Effort: Pulmonary effort is normal.     Breath sounds: Normal breath sounds.  Abdominal:     General: Abdomen is flat. Bowel sounds are normal. There is no distension.     Palpations: Abdomen is soft. There is no mass.     Tenderness: There is abdominal tenderness (right flank).  Skin:    General: Skin is warm and dry.  Neurological:     General: No focal deficit present.     Mental Status: He is alert.  Psychiatric:        Mood and Affect: Mood normal.        Behavior: Behavior normal.    Assessment/Plan: Please see individual problem list.  Acute right flank pain Assessment & Plan: UA in office negative. Pain is reproducible on palpation. Patient reports  it feels very sore. Advised that CT and lab work in ED was reassuring. Patient would like additional imaging. Will order [place] of Abdomen for further evaluation in specific area of pain. Advised patient to continue ibuprofen as needed for pain and alternate ice and heat on the area. Work note provided for today and tomorrow. Strict precautions given to patient.   Orders: -     POCT Urinalysis Dipstick (Automated) -     US Abdomen Limited; Future -     Urinalysis, Routine w reflex microscopic -     Urine Culture   Return if symptoms worsen or fail to improve.   Bethanie Dicker, NP-C  Primary Care - [facility]

## 2022-04-27 DIAGNOSIS — R109 Unspecified abdominal pain: Secondary | ICD-10-CM | POA: Insufficient documentation

## 2022-04-27 LAB — URINALYSIS, ROUTINE W REFLEX MICROSCOPIC
Bilirubin Urine: NEGATIVE
Hgb urine dipstick: NEGATIVE
Ketones, ur: NEGATIVE
Leukocytes,Ua: NEGATIVE
Nitrite: NEGATIVE
RBC / HPF: NONE SEEN (ref 0–?)
Specific Gravity, Urine: 1.02 (ref 1.000–1.030)
Total Protein, Urine: NEGATIVE
Urine Glucose: NEGATIVE
Urobilinogen, UA: 1 (ref 0.0–1.0)
WBC, UA: NONE SEEN (ref 0–?)
pH: 7 (ref 5.0–8.0)

## 2022-04-27 LAB — URINE CULTURE
MICRO NUMBER:: 14812399
Result:: NO GROWTH
SPECIMEN QUALITY:: ADEQUATE

## 2022-04-27 NOTE — Assessment & Plan Note (Addendum)
UA in office negative. Pain is reproducible on palpation. Patient reports it feels very sore. Advised that CT and lab work in ED was reassuring. Patient would like additional imaging. Will order [place] of Abdomen for further evaluation in specific area of pain. Advised patient to continue ibuprofen as needed for pain and alternate ice and heat on the area. Work note provided for today and tomorrow. Strict precautions given to patient.

## 2022-04-30 ENCOUNTER — Ambulatory Visit
Admission: RE | Admit: 2022-04-30 | Discharge: 2022-04-30 | Disposition: A | Discharge status: Home / Self Care | Payer: BC Managed Care – PPO | Source: Ambulatory Visit | Attending: Nurse Practitioner | Admitting: Nurse Practitioner

## 2022-04-30 DIAGNOSIS — R109 Unspecified abdominal pain: Secondary | ICD-10-CM | POA: Insufficient documentation

## 2022-06-22 ENCOUNTER — Other Ambulatory Visit (INDEPENDENT_AMBULATORY_CARE_PROVIDER_SITE_OTHER): Payer: BC Managed Care – PPO

## 2022-06-22 DIAGNOSIS — R739 Hyperglycemia, unspecified: Secondary | ICD-10-CM | POA: Diagnosis not present

## 2022-06-22 DIAGNOSIS — E7800 Pure hypercholesterolemia, unspecified: Secondary | ICD-10-CM

## 2022-06-22 LAB — LIPID PANEL
Cholesterol: 147 mg/dL (ref 0–200)
HDL: 50.1 mg/dL (ref >39.00)
LDL Cholesterol: 82 mg/dL (ref 0–99)
NonHDL: 97.24
Total CHOL/HDL Ratio: 3
Triglycerides: 78 mg/dL (ref 0.0–149.0)
VLDL: 15.6 mg/dL (ref 0.0–40.0)

## 2022-06-22 LAB — BASIC METABOLIC PANEL
BUN: 18 mg/dL (ref 6–23)
CO2: 30 mEq/L (ref 19–32)
Calcium: 9.2 mg/dL (ref 8.4–10.5)
Chloride: 103 mEq/L (ref 96–112)
Creatinine, Ser: 0.89 mg/dL (ref 0.40–1.50)
GFR: 89.97 mL/min (ref >60.00)
Glucose, Bld: 117 mg/dL — ABNORMAL HIGH (ref 70–99)
Potassium: 4 mEq/L (ref 3.5–5.1)
Sodium: 140 mEq/L (ref 135–145)

## 2022-06-22 LAB — HEPATIC FUNCTION PANEL
ALT: 17 U/L (ref 0–53)
AST: 19 U/L (ref 0–37)
Albumin: 4.2 g/dL (ref 3.5–5.2)
Alkaline Phosphatase: 50 U/L (ref 39–117)
Bilirubin, Direct: 0.1 mg/dL (ref 0.0–0.3)
Total Bilirubin: 0.6 mg/dL (ref 0.2–1.2)
Total Protein: 6.9 g/dL (ref 6.0–8.3)

## 2022-06-22 LAB — HEMOGLOBIN A1C: Hgb A1c MFr Bld: 5.6 % (ref 4.6–6.5)

## 2022-06-26 ENCOUNTER — Ambulatory Visit (INDEPENDENT_AMBULATORY_CARE_PROVIDER_SITE_OTHER): Payer: BC Managed Care – PPO | Admitting: Internal Medicine

## 2022-06-26 ENCOUNTER — Encounter: Payer: Self-pay | Admitting: Internal Medicine

## 2022-06-26 VITALS — BP 126/70 | HR 65 | Temp 97.9°F | Resp 16 | Ht 68.0 in | Wt 192.0 lb

## 2022-06-26 DIAGNOSIS — K219 Gastro-esophageal reflux disease without esophagitis: Secondary | ICD-10-CM

## 2022-06-26 DIAGNOSIS — R739 Hyperglycemia, unspecified: Secondary | ICD-10-CM | POA: Diagnosis not present

## 2022-06-26 DIAGNOSIS — Z8546 Personal history of malignant neoplasm of prostate: Secondary | ICD-10-CM | POA: Diagnosis not present

## 2022-06-26 DIAGNOSIS — R109 Unspecified abdominal pain: Secondary | ICD-10-CM | POA: Diagnosis not present

## 2022-06-26 DIAGNOSIS — E7800 Pure hypercholesterolemia, unspecified: Secondary | ICD-10-CM

## 2022-06-26 NOTE — Progress Notes (Signed)
Subjective:    [name], male    DOB: Dec 04, 1955, 67 y.o.   MRN: [id number]  Patient here for  Chief Complaint  Patient presents with   Medical Management of Chronic Issues    HPI Here for a scheduled follow up.  Was seen ER 04/20/22 - right flank pain/right abdominal pain. CT scan and labs unrevealing.  Had follow up [facility] 04/26/22 - persistent pain.  Tender to touch.  Ultrasound - negative.  Pain has resolved now.  No abdominal pain or cramping.  Acid reflux better.  No chest pain or sob reported.  No cough or congestion. Bowels stable.     Past Medical History:  Diagnosis Date   Allergy    Glaucoma    Hyperlipidemia    Prostate CA [facility])    Past Surgical History:  Procedure Laterality Date   BACK SURGERY  03/02/04   GLAUCOMA SURGERY Right 2012   Family History  Problem Relation Age of Onset   Hyperlipidemia Mother    Prostate cancer Father    Bone cancer Father    Social History   Socioeconomic History   Marital status: Married    Spouse name: Not on file   Number of children: Not on file   Years of education: Not on file   Highest education level: Not on file  Occupational History   Not on file  Tobacco Use   Smoking status: Never   Smokeless tobacco: Never  Vaping Use   Vaping Use: Never used  Substance and Sexual Activity   Alcohol use: No    Alcohol/week: 0.0 standard drinks of alcohol   Drug use: No   Sexual activity: Not on file  Other Topics Concern   Not on file  Social History Narrative   Married with kids   Social Determinants of Health   Financial Resource Strain: Not on file  Food Insecurity: Not on file  Transportation Needs: Not on file  Physical Activity: Not on file  Stress: Not on file  Social Connections: Not on file     Review of Systems  Constitutional:  Negative for appetite change and unexpected weight change.  HENT:  Negative for congestion and sinus pressure.   Respiratory:  Negative for cough,  chest tightness and shortness of breath.   Cardiovascular:  Negative for chest pain, palpitations and leg swelling.  Gastrointestinal:  Negative for abdominal pain, diarrhea, nausea and vomiting.  Genitourinary:  Negative for difficulty urinating and dysuria.  Musculoskeletal:  Negative for joint swelling and myalgias.  Skin:  Negative for color change and rash.  Neurological:  Negative for dizziness and headaches.  Psychiatric/Behavioral:  Negative for agitation and dysphoric mood.        Objective:     BP 126/70   Pulse 65   Temp 97.9 F (36.6 C)   Resp 16   Ht 5\' 8"  (1.727 m)   Wt 192 lb (87.1 kg)   SpO2 98%   BMI 29.19 kg/m  Wt Readings from Last 3 Encounters:  06/26/22 192 lb (87.1 kg)  04/26/22 196 lb 12.8 oz (89.3 kg)  03/05/22 199 lb (90.3 kg)    Physical Exam Vitals reviewed.  Constitutional:      General: He is not in acute distress.    Appearance: Normal appearance. He is well-developed.  HENT:     Head: Normocephalic and atraumatic.     Right Ear: External ear normal.     Left Ear: External ear  normal.  Eyes:     General: No scleral icterus.       Right eye: No discharge.        Left eye: No discharge.     Conjunctiva/sclera: Conjunctivae normal.  Cardiovascular:     Rate and Rhythm: Normal rate and regular rhythm.  Pulmonary:     Effort: Pulmonary effort is normal. No respiratory distress.     Breath sounds: Normal breath sounds.  Abdominal:     General: Bowel sounds are normal.     Palpations: Abdomen is soft.     Tenderness: There is no abdominal tenderness.  Musculoskeletal:        General: No swelling or tenderness.     Cervical back: Neck supple. No tenderness.  Lymphadenopathy:     Cervical: No cervical adenopathy.  Skin:    Findings: No erythema or rash.  Neurological:     Mental Status: He is alert.  Psychiatric:        Mood and Affect: Mood normal.        Behavior: Behavior normal.      Outpatient Encounter Medications as of  06/26/2022  Medication Sig   dorzolamide-timolol (COSOPT) 22.3-6.8 MG/ML ophthalmic solution    fexofenadine (ALLEGRA) 180 MG tablet Take 180 mg by mouth daily.   fluticasone (FLONASE) 50 MCG/ACT nasal spray Place 2 sprays into both nostrils daily. In each nostril   latanoprost (XALATAN) 0.005 % ophthalmic solution 1 drop at bedtime.   rosuvastatin (CRESTOR) 10 MG tablet TAKE 1 TABLET DAILY   [DISCONTINUED] cyclobenzaprine (FLEXERIL) 10 MG tablet Take 1 tablet (10 mg total) by mouth at bedtime. (Patient not taking: Reported on 04/02/2022)   [DISCONTINUED] predniSONE (DELTASONE) 20 MG tablet Take 2 tablets (40 mg total) by mouth daily. (Patient not taking: Reported on 04/02/2022)   No facility-administered encounter medications on file as of 06/26/2022.     Lab Results  Component Value Date   WBC 4.9 10/17/2021   HGB 13.7 10/17/2021   HCT 39.8 10/17/2021   PLT 182.0 10/17/2021   GLUCOSE 117 (H) 06/22/2022   CHOL 147 06/22/2022   TRIG 78.0 06/22/2022   HDL 50.10 06/22/2022   LDLCALC 82 06/22/2022   ALT 17 06/22/2022   AST 19 06/22/2022   NA 140 06/22/2022   K 4.0 06/22/2022   CL 103 06/22/2022   CREATININE 0.89 06/22/2022   BUN 18 06/22/2022   CO2 30 06/22/2022   TSH 2.32 10/17/2021   PSA 1.55 06/05/2021   INR 1.0 12/19/2012   HGBA1C 5.6 06/22/2022    US Abdomen Limited  Result Date: 04/30/2022 CLINICAL DATA:  Right flank pain and lower quadrant pain for 3 months. Patient's referring physician requested ultrasound of soft tissues over the right flank pain. EXAM: ULTRASOUND ABDOMEN LIMITED COMPARISON:  None Available. FINDINGS: No focal abnormal discrete cystic or solid lesion or hernia in the soft tissues over the right flank pain. IMPRESSION: No acute abnormality identified. Electronically Signed   By: Sherian Rein M.D.   On: 04/30/2022 09:33       Assessment & Plan:  Hyperglycemia Assessment & Plan: Low carb diet and exercise.  Follow met b and a1c. A1c 5.5.   Orders: -      Hemoglobin A1c; Future  Hypercholesterolemia Assessment & Plan: Tolerating crestor at current dosing regimen.  Low cholesterol diet and exercise.  Follow lipid panel and liver function tests.   Lab Results  Component Value Date   CHOL 147 06/22/2022   HDL 50.10  06/22/2022   LDLCALC 82 06/22/2022   TRIG 78.0 06/22/2022   CHOLHDL 3 06/22/2022     Orders: -     CBC with Differential/Platelet; Future -     Lipid panel; Future -     Hepatic function panel; Future -     TSH; Future  Gastroesophageal reflux disease, unspecified whether esophagitis present Assessment & Plan: Acid reflux better.    History of prostate cancer Assessment & Plan: Followed by urology.  Stable per report.    Acute right flank pain Assessment & Plan: CT scan and ultrasound - negative.  Pain resolved.  Follow.       Dale [place], MD

## 2022-07-01 ENCOUNTER — Encounter: Payer: Self-pay | Admitting: Internal Medicine

## 2022-07-01 NOTE — Assessment & Plan Note (Signed)
Acid reflux better.

## 2022-07-01 NOTE — Assessment & Plan Note (Signed)
Low carb diet and exercise.  Follow met b and a1c. A1c 5.5.  

## 2022-07-01 NOTE — Assessment & Plan Note (Signed)
Tolerating crestor at current dosing regimen.  Low cholesterol diet and exercise.  Follow lipid panel and liver function tests.   Lab Results  Component Value Date   CHOL 147 06/22/2022   HDL 50.10 06/22/2022   LDLCALC 82 06/22/2022   TRIG 78.0 06/22/2022   CHOLHDL 3 06/22/2022

## 2022-07-01 NOTE — Assessment & Plan Note (Signed)
Followed by urology.  Stable per report.  

## 2022-07-01 NOTE — Assessment & Plan Note (Signed)
CT scan and ultrasound - negative.  Pain resolved.  Follow.

## 2022-08-13 DIAGNOSIS — L570 Actinic keratosis: Secondary | ICD-10-CM | POA: Diagnosis not present

## 2022-08-13 DIAGNOSIS — D2272 Melanocytic nevi of left lower limb, including hip: Secondary | ICD-10-CM | POA: Diagnosis not present

## 2022-08-13 DIAGNOSIS — D2271 Melanocytic nevi of right lower limb, including hip: Secondary | ICD-10-CM | POA: Diagnosis not present

## 2022-08-13 DIAGNOSIS — D225 Melanocytic nevi of trunk: Secondary | ICD-10-CM | POA: Diagnosis not present

## 2022-08-13 DIAGNOSIS — D2262 Melanocytic nevi of left upper limb, including shoulder: Secondary | ICD-10-CM | POA: Diagnosis not present

## 2022-08-13 DIAGNOSIS — Z85828 Personal history of other malignant neoplasm of skin: Secondary | ICD-10-CM | POA: Diagnosis not present

## 2022-08-13 DIAGNOSIS — D2261 Melanocytic nevi of right upper limb, including shoulder: Secondary | ICD-10-CM | POA: Diagnosis not present

## 2022-08-13 DIAGNOSIS — B078 Other viral warts: Secondary | ICD-10-CM | POA: Diagnosis not present

## 2022-08-23 ENCOUNTER — Other Ambulatory Visit: Payer: Self-pay | Admitting: *Deleted

## 2022-08-23 DIAGNOSIS — C61 Malignant neoplasm of prostate: Secondary | ICD-10-CM

## 2022-09-05 ENCOUNTER — Other Ambulatory Visit: Payer: BC Managed Care – PPO

## 2022-09-05 DIAGNOSIS — C61 Malignant neoplasm of prostate: Secondary | ICD-10-CM | POA: Diagnosis not present

## 2022-09-06 LAB — PSA: Prostate Specific Ag, Serum: 2.5 ng/mL (ref 0.0–4.0)

## 2022-09-24 ENCOUNTER — Ambulatory Visit
Admission: EM | Admit: 2022-09-24 | Discharge: 2022-09-24 | Disposition: A | Discharge status: Home / Self Care | Payer: BC Managed Care – PPO | Attending: Emergency Medicine | Admitting: Emergency Medicine

## 2022-09-24 DIAGNOSIS — R0981 Nasal congestion: Secondary | ICD-10-CM | POA: Diagnosis not present

## 2022-09-24 MED ORDER — AZITHROMYCIN 250 MG PO TABS: 250.0000 mg | ORAL_TABLET | Freq: Every day | ORAL | 0 refills | Status: DC

## 2022-09-24 MED ORDER — IPRATROPIUM BROMIDE 0.03 % NA SOLN: 2.0000 | Freq: Two times a day (BID) | NASAL | 12 refills | Status: AC

## 2022-09-24 MED ORDER — CETIRIZINE HCL 10 MG PO TABS: 10.0000 mg | ORAL_TABLET | Freq: Every day | ORAL | 0 refills | Status: DC

## 2022-09-24 NOTE — Discharge Instructions (Signed)
Today you were evaluated for your nasal congestion which is most likely related to a virus versus the seasonal weather change  Begin azithromycin to provide coverage for bacteria to ensure that symptoms do not worsen and progressed to a pneumonia   You may use nasal spray every morning and every evening to help clear out congestion from the sinuses  You may use Zyrtec allergy medicine every morning to help dry up congestion  You may also use over-the-counter Sudafed as needed to further help reduce congestion  For burning sensation to the nose this is related to dryness  -you may coat the nose with a Vaseline or similar product using a Q-tip -To help with the burning sensation you may purchase over-the-counter saline nasal spray to moisten the naris and use throughout the day -You may use a humidifier at bedtime which helps to moisten the air, if you do not have a humidifier you may steam up your bathroom as needed and sit inside for 10 to 15 minutes  For any concerns regarding your symptoms you may follow-up with his urgent care as needed

## 2022-09-24 NOTE — ED Triage Notes (Signed)
Seen  by provider only

## 2022-09-24 NOTE — ED Provider Notes (Signed)
Jeremy Fischer    CSN: [id number] Arrival date & time: 09/24/22  1530      History   Chief Complaint No chief complaint on file.   Jeremy Fischer is a 67 y.o. male.   Patient presents for evaluation of nasal congestion with yellow sputum, postnasal drip and a mild nonproductive cough Present for 3 days.  Accompanying burning sensation to the nares.  Known sick contacts at work.  Tolerating food and liquids.  Has attempted use of ibuprofen which has been minimally effective.  Denies fever chills body aches, ear pain sore throat or shortness of breath or wheezing.  Past Medical History:  Diagnosis Date   Allergy    Glaucoma    Hyperlipidemia    Prostate CA [facility])     Patient Active Problem List   Diagnosis Date Noted   Acute right flank pain 04/27/2022   GERD (gastroesophageal reflux disease) 05/28/2021   Colon cancer screening 02/21/2020   Hypercholesterolemia 04/01/2018   Acute non-recurrent sinusitis 05/01/2017   History of prostate cancer 04/24/2015   Pre-op evaluation 04/24/2015   Health care maintenance 10/24/2014   Rash 10/24/2014   Elevated blood pressure reading 10/18/2013   Abnormal CT scan, neck 05/10/2013   Hyperglycemia 05/10/2013   Benign prostatic hyperplasia 10/22/2012   Glaucoma 10/15/2012   Environmental allergies 10/15/2012    Past Surgical History:  Procedure Laterality Date   BACK SURGERY  03/02/04   GLAUCOMA SURGERY Right 2012       Home Medications    Prior to Admission medications   Medication Sig Start Date End Date Taking? Authorizing Provider  azithromycin (ZITHROMAX) 250 MG tablet Take 1 tablet (250 mg total) by mouth daily. Take first 2 tablets together, then 1 every day until finished. 09/24/22  Yes Janiylah Hannis, Elita Boone, NP  cetirizine (ZYRTEC ALLERGY) 10 MG tablet Take 1 tablet (10 mg total) by mouth daily. 09/24/22  Yes Catarino Vold R, NP  ipratropium (ATROVENT) 0.03 % nasal spray Place 2 sprays into both nostrils every 12  (twelve) hours. 09/24/22  Yes Valinda Hoar, NP  dorzolamide-timolol (COSOPT) 22.3-6.8 MG/ML ophthalmic solution  11/03/12   [provider]  fexofenadine (ALLEGRA) 180 MG tablet Take 180 mg by mouth daily.    [provider]  fluticasone (FLONASE) 50 MCG/ACT nasal spray Place 2 sprays into both nostrils daily. In each nostril 05/01/17   Karie Schwalbe, MD  latanoprost (XALATAN) 0.005 % ophthalmic solution 1 drop at bedtime. 04/07/21   [provider]  rosuvastatin (CRESTOR) 10 MG tablet TAKE 1 TABLET DAILY 02/14/22   Dale Keswick, MD    Family History Family History  Problem Relation Age of Onset   Hyperlipidemia Mother    Prostate cancer Father    Bone cancer Father     Social History Social History   Tobacco Use   Smoking status: Never   Smokeless tobacco: Never  Vaping Use   Vaping status: Never Used  Substance Use Topics   Alcohol use: No    Alcohol/week: 0.0 standard drinks of alcohol   Drug use: No     Allergies   Iodinated contrast media   Review of Systems Review of Systems   Physical Exam Triage Vital Signs ED Triage Vitals [09/24/22 1602]  Encounter Vitals Group     BP (!) 140/79     Systolic BP Percentile      Diastolic BP Percentile      Pulse Rate 75     Resp  18     Temp 99.8 F (37.7 C)     Temp Source Oral     SpO2 100 %     Weight      Height      Head Circumference      Peak Flow      Pain Score      Pain Loc      Pain Education      Exclude from Growth Chart    No data found.  Updated Vital Signs BP (!) 140/79 (BP Location: Left Arm)   Pulse 75   Temp 99.8 F (37.7 C) (Oral)   Resp 18   SpO2 100%   Visual Acuity Right Eye Distance:   Left Eye Distance:   Bilateral Distance:    Right Eye Near:   Left Eye Near:    Bilateral Near:     Physical Exam Constitutional:      Appearance: Normal appearance.  HENT:     Head: Normocephalic.     Right Ear: Tympanic membrane, ear canal and  external ear normal.     Left Ear: Tympanic membrane, ear canal and external ear normal.     Nose: Congestion present. No rhinorrhea.     Mouth/Throat:     Mouth: Mucous membranes are moist.     Pharynx: Oropharynx is clear. Posterior oropharyngeal erythema present.  Eyes:     Extraocular Movements: Extraocular movements intact.  Cardiovascular:     Rate and Rhythm: Normal rate and regular rhythm.     Pulses: Normal pulses.     Heart sounds: Normal heart sounds.  Pulmonary:     Effort: Pulmonary effort is normal.     Breath sounds: Normal breath sounds.  Musculoskeletal:     Cervical back: Normal range of motion and neck supple.  Skin:    General: Skin is warm and dry.  Neurological:     Mental Status: He is alert and oriented to person, place, and time. Mental status is at baseline.      UC Treatments / Results  Labs (all labs ordered are listed, but only abnormal results are displayed) Labs Reviewed - No data to display  EKG   Radiology No results found.  Procedures Procedures (including critical care time)  Medications Ordered in UC Medications - No data to display  Initial Impression / Assessment and Plan / UC Course  I have reviewed the triage vital signs and the nursing notes.  Pertinent labs & imaging results that were available during my care of the patient were reviewed by me and considered in my medical decision making (see chart for details).  Nasal congestion  Etiology most likely viral versus seasonal weather change, discussed with patient prophylactically providing azithromycin, as well as treating symptoms with Atrovent nasal spray and Zyrtec, recommended additional over-the-counter medicine such as pseudoephedrine for support, advised use of Vaseline, saline nasal mist and humidifier for management of burning sensation to the naris which is most likely result of dryness related to congestion, advised follow-up as needed for worsening symptoms Final  Clinical Impressions(s) / UC Diagnoses   Final diagnoses:  Nasal congestion     Discharge Instructions      Today you were evaluated for your nasal congestion which is most likely related to a virus versus the seasonal weather change  Begin azithromycin to provide coverage for bacteria to ensure that symptoms do not worsen and progressed to a pneumonia   You may use nasal spray every morning and  every evening to help clear out congestion from the sinuses  You may use Zyrtec allergy medicine every morning to help dry up congestion  You may also use over-the-counter Sudafed as needed to further help reduce congestion  For burning sensation to the nose this is related to dryness  -you may coat the nose with a Vaseline or similar product using a Q-tip -To help with the burning sensation you may purchase over-the-counter saline nasal spray to moisten the naris and use throughout the day -You may use a humidifier at bedtime which helps to moisten the air, if you do not have a humidifier you may steam up your bathroom as needed and sit inside for 10 to 15 minutes  For any concerns regarding your symptoms you may follow-up with his urgent care as needed   ED Prescriptions     Medication Sig Dispense Auth. Provider   azithromycin (ZITHROMAX) 250 MG tablet Take 1 tablet (250 mg total) by mouth daily. Take first 2 tablets together, then 1 every day until finished. 6 tablet Aveleen Nevers R, NP   ipratropium (ATROVENT) 0.03 % nasal spray Place 2 sprays into both nostrils every 12 (twelve) hours. 30 mL Averyanna Sax, Hansel Starling R, NP   cetirizine (ZYRTEC ALLERGY) 10 MG tablet Take 1 tablet (10 mg total) by mouth daily. 15 tablet Hartwell Vandiver, Elita Boone, NP      PDMP not reviewed this encounter.   Valinda Hoar, [place] 09/24/22 [phone]

## 2022-10-23 ENCOUNTER — Other Ambulatory Visit (INDEPENDENT_AMBULATORY_CARE_PROVIDER_SITE_OTHER): Payer: BC Managed Care – PPO

## 2022-10-23 DIAGNOSIS — R739 Hyperglycemia, unspecified: Secondary | ICD-10-CM

## 2022-10-23 DIAGNOSIS — E7800 Pure hypercholesterolemia, unspecified: Secondary | ICD-10-CM | POA: Diagnosis not present

## 2022-10-23 DIAGNOSIS — H401133 Primary open-angle glaucoma, bilateral, severe stage: Secondary | ICD-10-CM | POA: Diagnosis not present

## 2022-10-23 LAB — CBC WITH DIFFERENTIAL/PLATELET
Basophils Absolute: 0.1 10*3/uL (ref 0.0–0.1)
Basophils Relative: 1.1 % (ref 0.0–3.0)
Eosinophils Absolute: 0.3 10*3/uL (ref 0.0–0.7)
Eosinophils Relative: 6.5 % — ABNORMAL HIGH (ref 0.0–5.0)
HCT: 40.5 % (ref 39.0–52.0)
Hemoglobin: 13.6 g/dL (ref 13.0–17.0)
Lymphocytes Relative: 29.1 % (ref 12.0–46.0)
Lymphs Abs: 1.5 10*3/uL (ref 0.7–4.0)
MCHC: 33.6 g/dL (ref 30.0–36.0)
MCV: 96.3 fL (ref 78.0–100.0)
Monocytes Absolute: 0.4 10*3/uL (ref 0.1–1.0)
Monocytes Relative: 8.2 % (ref 3.0–12.0)
Neutro Abs: 2.8 10*3/uL (ref 1.4–7.7)
Neutrophils Relative %: 55.1 % (ref 43.0–77.0)
Platelets: 178 10*3/uL (ref 150.0–400.0)
RBC: 4.21 Mil/uL — ABNORMAL LOW (ref 4.22–5.81)
RDW: 12.9 % (ref 11.5–15.5)
WBC: 5.1 10*3/uL (ref 4.0–10.5)

## 2022-10-23 LAB — LIPID PANEL
Cholesterol: 108 mg/dL (ref 0–200)
HDL: 49 mg/dL (ref >39.00)
LDL Cholesterol: 49 mg/dL (ref 0–99)
NonHDL: 59.02
Total CHOL/HDL Ratio: 2
Triglycerides: 52 mg/dL (ref 0.0–149.0)
VLDL: 10.4 mg/dL (ref 0.0–40.0)

## 2022-10-23 LAB — HEPATIC FUNCTION PANEL
ALT: 20 U/L (ref 0–53)
AST: 18 U/L (ref 0–37)
Albumin: 4.1 g/dL (ref 3.5–5.2)
Alkaline Phosphatase: 60 U/L (ref 39–117)
Bilirubin, Direct: 0.2 mg/dL (ref 0.0–0.3)
Total Bilirubin: 0.6 mg/dL (ref 0.2–1.2)
Total Protein: 6.1 g/dL (ref 6.0–8.3)

## 2022-10-23 LAB — HEMOGLOBIN A1C: Hgb A1c MFr Bld: 5.7 % (ref 4.6–6.5)

## 2022-10-23 LAB — TSH: TSH: 2.67 u[IU]/mL (ref 0.35–5.50)

## 2022-10-25 ENCOUNTER — Ambulatory Visit (INDEPENDENT_AMBULATORY_CARE_PROVIDER_SITE_OTHER): Payer: BC Managed Care – PPO | Admitting: Internal Medicine

## 2022-10-25 VITALS — BP 136/70 | HR 60 | Temp 97.9°F | Resp 16 | Ht 68.0 in | Wt 193.6 lb

## 2022-10-25 DIAGNOSIS — N400 Enlarged prostate without lower urinary tract symptoms: Secondary | ICD-10-CM

## 2022-10-25 DIAGNOSIS — R739 Hyperglycemia, unspecified: Secondary | ICD-10-CM

## 2022-10-25 DIAGNOSIS — E7800 Pure hypercholesterolemia, unspecified: Secondary | ICD-10-CM | POA: Diagnosis not present

## 2022-10-25 DIAGNOSIS — Z8546 Personal history of malignant neoplasm of prostate: Secondary | ICD-10-CM

## 2022-10-25 MED ORDER — ROSUVASTATIN CALCIUM 10 MG PO TABS: ORAL_TABLET | ORAL | 1 refills | Status: DC

## 2022-10-25 NOTE — Progress Notes (Signed)
Subjective:    [name], male    DOB: [birth year], 67 y.o.   MRN: [id number]  Patient here for  Chief Complaint  Patient presents with   Medical Management of Chronic Issues    HPI Here to follow up regarding hypercholesterolemia and hyperglycemia.  Overall doing relatively well.  Stays active.  No chest pain or sob reported.  No increased heart rate or palpitations.  Increased stress with work.  Overall he feels he is handling things relatively well.  No increased cough or congestion.  No abdominal pain or bowel change.    Past Medical History:  Diagnosis Date   Allergy    Glaucoma    Hyperlipidemia    Prostate CA [facility])    Past Surgical History:  Procedure Laterality Date   BACK SURGERY  03/02/04   GLAUCOMA SURGERY Right 2012   Family History  Problem Relation Age of Onset   Hyperlipidemia Mother    Prostate cancer Father    Bone cancer Father    Social History   Socioeconomic History   Marital status: Married    Spouse name: Not on file   Number of children: Not on file   Years of education: Not on file   Highest education level: Not on file  Occupational History   Not on file  Tobacco Use   Smoking status: Never   Smokeless tobacco: Never  Vaping Use   Vaping status: Never Used  Substance and Sexual Activity   Alcohol use: No    Alcohol/week: 0.0 standard drinks of alcohol   Drug use: No   Sexual activity: Not on file  Other Topics Concern   Not on file  Social History Narrative   Married with kids   Social Determinants of Health   Financial Resource Strain: Not on file  Food Insecurity: Not on file  Transportation Needs: Not on file  Physical Activity: Not on file  Stress: Not on file  Social Connections: Not on file     Review of Systems  Constitutional:  Negative for appetite change and unexpected weight change.  HENT:  Negative for congestion and sinus pressure.   Respiratory:  Negative for cough, chest tightness  and shortness of breath.   Cardiovascular:  Negative for chest pain and palpitations.  Gastrointestinal:  Negative for abdominal pain, diarrhea, nausea and vomiting.  Genitourinary:  Negative for difficulty urinating.  Musculoskeletal:  Negative for joint swelling and myalgias.  Skin:  Negative for color change and rash.  Neurological:  Negative for dizziness and headaches.  Psychiatric/Behavioral:  Negative for agitation and dysphoric mood.        Objective:     BP 136/70   Pulse 60   Temp 97.9 F (36.6 C)   Resp 16   Ht 5\' 8"  (1.727 m)   Wt 193 lb 9.6 oz (87.8 kg)   SpO2 98%   BMI 29.44 kg/m  Wt Readings from Last 3 Encounters:  10/25/22 193 lb 9.6 oz (87.8 kg)  06/26/22 192 lb (87.1 kg)  04/26/22 196 lb 12.8 oz (89.3 kg)    Physical Exam Vitals reviewed.  Constitutional:      General: He is not in acute distress.    Appearance: Normal appearance. He is well-developed.  HENT:     Head: Normocephalic and atraumatic.     Right Ear: External ear normal.     Left Ear: External ear normal.  Eyes:     General: No scleral icterus.  Right eye: No discharge.        Left eye: No discharge.     Conjunctiva/sclera: Conjunctivae normal.  Cardiovascular:     Rate and Rhythm: Normal rate and regular rhythm.  Pulmonary:     Effort: Pulmonary effort is normal. No respiratory distress.     Breath sounds: Normal breath sounds.  Abdominal:     General: Bowel sounds are normal.     Palpations: Abdomen is soft.     Tenderness: There is no abdominal tenderness.  Musculoskeletal:        General: No swelling or tenderness.     Cervical back: Neck supple. No tenderness.  Lymphadenopathy:     Cervical: No cervical adenopathy.  Skin:    Findings: No erythema or rash.  Neurological:     Mental Status: He is alert.  Psychiatric:        Mood and Affect: Mood normal.        Behavior: Behavior normal.      Outpatient Encounter Medications as of 10/25/2022  Medication Sig    cetirizine (ZYRTEC ALLERGY) 10 MG tablet Take 1 tablet (10 mg total) by mouth daily.   dorzolamide-timolol (COSOPT) 22.3-6.8 MG/ML ophthalmic solution    fexofenadine (ALLEGRA) 180 MG tablet Take 180 mg by mouth daily.   fluticasone (FLONASE) 50 MCG/ACT nasal spray Place 2 sprays into both nostrils daily. In each nostril   ipratropium (ATROVENT) 0.03 % nasal spray Place 2 sprays into both nostrils every 12 (twelve) hours.   latanoprost (XALATAN) 0.005 % ophthalmic solution 1 drop at bedtime.   rosuvastatin (CRESTOR) 10 MG tablet TAKE 1 TABLET DAILY   [DISCONTINUED] azithromycin (ZITHROMAX) 250 MG tablet Take 1 tablet (250 mg total) by mouth daily. Take first 2 tablets together, then 1 every day until finished.   [DISCONTINUED] rosuvastatin (CRESTOR) 10 MG tablet TAKE 1 TABLET DAILY   No facility-administered encounter medications on file as of 10/25/2022.     Lab Results  Component Value Date   WBC 5.1 10/23/2022   HGB 13.6 10/23/2022   HCT 40.5 10/23/2022   PLT 178.0 10/23/2022   GLUCOSE 117 (H) 06/22/2022   CHOL 108 10/23/2022   TRIG 52.0 10/23/2022   HDL 49.00 10/23/2022   LDLCALC 49 10/23/2022   ALT 20 10/23/2022   AST 18 10/23/2022   NA 140 06/22/2022   K 4.0 06/22/2022   CL 103 06/22/2022   CREATININE 0.89 06/22/2022   BUN 18 06/22/2022   CO2 30 06/22/2022   TSH 2.67 10/23/2022   PSA 1.55 06/05/2021   INR 1.0 12/19/2012   HGBA1C 5.7 10/23/2022    No results found.     Assessment & Plan:  Hyperglycemia Assessment & Plan: Low carb diet and exercise.  Follow met b and a1c. A1c 5.7   Hypercholesterolemia Assessment & Plan: Tolerating crestor at current dosing regimen.  Low cholesterol diet and exercise.  Follow lipid panel and liver function tests.   Lab Results  Component Value Date   CHOL 108 10/23/2022   HDL 49.00 10/23/2022   LDLCALC 49 10/23/2022   TRIG 52.0 10/23/2022   CHOLHDL 2 10/23/2022      History of prostate cancer Assessment &  Plan: Followed by urology.  Stable per report.    Benign prostatic hyperplasia, unspecified whether lower urinary tract symptoms present Assessment & Plan: Seeing Dr Lonna Cobb now.  Evaluated 02/2022.  Recommended f/u in one year.  PSA 2.2.  per review of result message, slight increase.  Recommended f/u  psa in 6 months.  Need to confirm f/u.    Other orders -     Rosuvastatin Calcium; TAKE 1 TABLET DAILY  Dispense: 90 tablet; Refill: 1     Dale Farmer, MD

## 2022-10-26 ENCOUNTER — Ambulatory Visit: Payer: BC Managed Care – PPO | Admitting: Internal Medicine

## 2022-10-28 ENCOUNTER — Telehealth: Payer: Self-pay | Admitting: Internal Medicine

## 2022-10-28 ENCOUNTER — Encounter: Payer: Self-pay | Admitting: Internal Medicine

## 2022-10-28 NOTE — Assessment & Plan Note (Signed)
Seeing Dr Lonna Cobb now.  Evaluated 02/2022.  Recommended f/u in one year.  PSA 2.2.  per review of result message, slight increase.  Recommended f/u psa in 6 months.  Need to confirm f/u.

## 2022-10-28 NOTE — Assessment & Plan Note (Signed)
Tolerating crestor at current dosing regimen.  Low cholesterol diet and exercise.  Follow lipid panel and liver function tests.   Lab Results  Component Value Date   CHOL 108 10/23/2022   HDL 49.00 10/23/2022   LDLCALC 49 10/23/2022   TRIG 52.0 10/23/2022   CHOLHDL 2 10/23/2022

## 2022-10-28 NOTE — Assessment & Plan Note (Signed)
Low carb diet and exercise.  Follow met b and a1c. A1c 5.7

## 2022-10-28 NOTE — Assessment & Plan Note (Signed)
Followed by urology.  Stable per report.

## 2022-10-28 NOTE — Telephone Encounter (Signed)
He is now seeing urology.  Saw Dr Lonna Cobb 02/16/22.  Per review of result message, psa 2.2.  is wnl, but per note, slightly increased.  Had recommended psa in 6 months.  If not scheduled, needs to schedule.  Thanks.  Can do lab here if any problems.

## 2022-10-30 DIAGNOSIS — H353131 Nonexudative age-related macular degeneration, bilateral, early dry stage: Secondary | ICD-10-CM | POA: Diagnosis not present

## 2022-10-30 DIAGNOSIS — H401133 Primary open-angle glaucoma, bilateral, severe stage: Secondary | ICD-10-CM | POA: Diagnosis not present

## 2022-10-30 DIAGNOSIS — H43813 Vitreous degeneration, bilateral: Secondary | ICD-10-CM | POA: Diagnosis not present

## 2022-10-30 DIAGNOSIS — H2513 Age-related nuclear cataract, bilateral: Secondary | ICD-10-CM | POA: Diagnosis not present

## 2022-10-31 NOTE — Telephone Encounter (Signed)
Patient had f/u PSA in Aug 2024 and is already scheduled for lab and f/u with [facility] 02/2023

## 2023-02-28 ENCOUNTER — Other Ambulatory Visit: Payer: PPO

## 2023-02-28 ENCOUNTER — Other Ambulatory Visit: Payer: Self-pay

## 2023-02-28 DIAGNOSIS — C61 Malignant neoplasm of prostate: Secondary | ICD-10-CM

## 2023-03-01 LAB — PSA: Prostate Specific Ag, Serum: 3 ng/mL (ref 0.0–4.0)

## 2023-03-04 ENCOUNTER — Encounter: Payer: Self-pay | Admitting: Urology

## 2023-03-04 ENCOUNTER — Ambulatory Visit: Payer: PPO | Admitting: Urology

## 2023-03-04 VITALS — BP 144/79 | HR 55 | Ht 68.0 in | Wt 193.0 lb

## 2023-03-04 DIAGNOSIS — C61 Malignant neoplasm of prostate: Secondary | ICD-10-CM | POA: Diagnosis not present

## 2023-03-04 NOTE — Progress Notes (Signed)
 I, Jeremy Fischer, acting as a scribe for Jeremy Altes, MD., have documented all relevant documentation on the behalf of Jeremy Altes, MD, as directed by Jeremy Altes, MD while in the presence of Jeremy Altes, MD.  03/04/2023 2:31 PM   [name] 10/30/1955 [id number]  Referring provider: Dale Amistad, MD [address] [place],  [place] [address]  Chief Complaint  Patient presents with   Elevated PSA   Urologic history: 1.  Prostate cancer Intermediate risk prostate cancer diagnosed 2017 (Gleason 3+4) Treated HIFU right hemi-ablation Left-sided disease diagnosed 2019 with rising PSA and underwent left hemi-ablation   HPI: Jeremy Fischer is a 68 y.o. male presents for annual follow-up.  Initially seen 03/05/2022 for transfer urologic care from [facility]. His PSA at that visit was 2.2 with prior PSA level of 1.9 in July 2023.  Follow-up PSA August 2024 had increased to 2.5, and most recent PSA 03/06/23 was 3.0  No bothersome lower urinary tract symptoms.  No symptoms. No dysuria or gross hematuria.   PSA trend   Prostate Specific Ag, Serum  Latest Ref Rng 0.0 - 4.0 ng/mL  03/05/2022 2.2   09/05/2022 2.5   02/28/2023 3.0     PMH: Past Medical History:  Diagnosis Date   Allergy    Glaucoma    Hyperlipidemia    Prostate CA [facility])     Surgical History: Past Surgical History:  Procedure Laterality Date   BACK SURGERY  03/02/04   GLAUCOMA SURGERY Right 2012    Home Medications:  Allergies as of 03/04/2023       Reactions   Iodinated Contrast Media Rash, Hives        Medication List        Accurate as of March 04, 2023  2:31 PM. If you have any questions, ask your nurse or doctor.          STOP taking these medications    cetirizine 10 MG tablet Commonly known as: ZyrTEC Allergy Stopped by: Verna Czech Philmore Lepore   fluticasone 50 MCG/ACT nasal spray Commonly known as: FLONASE Stopped by: Jeremy Fischer       TAKE these medications    dorzolamide-timolol 2-0.5 % ophthalmic solution Commonly known as: COSOPT   fexofenadine 180 MG tablet Commonly known as: ALLEGRA Take 180 mg by mouth daily.   ipratropium 0.03 % nasal spray Commonly known as: ATROVENT Place 2 sprays into both nostrils every 12 (twelve) hours.   latanoprost 0.005 % ophthalmic solution Commonly known as: XALATAN 1 drop at bedtime.   rosuvastatin 10 MG tablet Commonly known as: CRESTOR TAKE 1 TABLET DAILY   VITAMIN C PO Take by mouth.   VITAMIN D PO Take by mouth.        Allergies:  Allergies  Allergen Reactions   Iodinated Contrast Media Rash and Hives    Family History: Family History  Problem Relation Age of Onset   Hyperlipidemia Mother    Prostate cancer Father    Bone cancer Father     Social History:  reports that he has never smoked. He has never used smokeless tobacco. He reports that he does not drink alcohol and does not use drugs.   Physical Exam: BP (!) 144/79   Pulse (!) 55   Ht 5\' 8"  (1.727 m)   Wt 193 lb (87.5 kg)   BMI 29.35 kg/m   Constitutional:  Alert and oriented, No acute distress. HEENT:  [place] AT, moist mucus membranes.  Trachea midline, no masses. Cardiovascular: No clubbing, cyanosis, or edema. Respiratory: Normal respiratory effort, no increased work of breathing. GI: Abdomen is soft, nontender, nondistended, no abdominal masses Skin: No rashes, bruises or suspicious lesions. Neurologic: Grossly intact, no focal deficits, moving all 4 extremities. Psychiatric: Normal mood and affect.  Assessment & Plan:    1. Intermediate-risk prostate cancer Treated with HIFU x 2 Rising PSA Recommend further evaluation with PSMA/PET.  [facility] [address], [address] [place], [place] [address] [phone]

## 2023-03-06 ENCOUNTER — Other Ambulatory Visit: Payer: BC Managed Care – PPO

## 2023-03-07 ENCOUNTER — Encounter: Payer: Self-pay | Admitting: Urology

## 2023-03-11 ENCOUNTER — Ambulatory Visit: Payer: BC Managed Care – PPO | Admitting: Urology

## 2023-03-13 ENCOUNTER — Ambulatory Visit
Admission: RE | Admit: 2023-03-13 | Discharge: 2023-03-13 | Disposition: A | Discharge status: Home / Self Care | Payer: PPO | Source: Ambulatory Visit | Attending: Urology | Admitting: Urology

## 2023-03-13 DIAGNOSIS — C61 Malignant neoplasm of prostate: Secondary | ICD-10-CM

## 2023-03-13 MED ORDER — FLOTUFOLASTAT F 18 GALLIUM 296-5846 MBQ/ML IV SOLN
8.0000 | Freq: Once | INTRAVENOUS | Status: AC
  Administered 2023-03-13: 8.74 via INTRAVENOUS
  Filled 2023-03-13: qty 8

## 2023-04-11 ENCOUNTER — Telehealth: Payer: Self-pay | Admitting: Urology

## 2023-04-11 DIAGNOSIS — C61 Malignant neoplasm of prostate: Secondary | ICD-10-CM

## 2023-04-11 NOTE — Telephone Encounter (Signed)
 Patient's wife Steve Rattler) called regarding PET results. She said they have seen results in chart, but are having trouble understanding them. She asked if they need an appointment to come in and discuss, or if someone can call them. Please advise patient.

## 2023-04-12 NOTE — Telephone Encounter (Signed)
 Urologic history: 1.  Prostate cancer Intermediate risk prostate cancer diagnosed 2017 (Gleason 3+4) Treated HIFU right hemi-ablation Left-sided disease diagnosed 2019 with rising PSA and underwent left hemi-ablation   I contacted Jeremy Fischer to discuss his PSMA/PET.  PSA has increased over the last year from 2.2-2.5-3.0 PSMA PET showed small focus of radiotracer activity in the left posterior prostate felt suspicious for cancer persistence/recurrence.  He had no adenopathy, visceral or skeletal metastasis.  We discussed TRUS/biopsy the prostate for tissue diagnosis.  Salvage treatments were discussed including radical prostatectomy, radiation modalities.  His original pathology was Gleason 3+4 and if biopsy showed low-grade or favorable intermediate prostate cancer surveillance would be an option.  He was interested in pursuing a multidisciplinary oncology clinic appointment at [facility].  Referral was placed.

## 2023-04-22 ENCOUNTER — Telehealth: Payer: Self-pay | Admitting: Urology

## 2023-04-22 DIAGNOSIS — C61 Malignant neoplasm of prostate: Secondary | ICD-10-CM

## 2023-04-22 NOTE — Telephone Encounter (Signed)
 So he has a referral in to [facility] for prostate cancer and they received a message saying that they do not accept his insurance. They want to know if it can be changed and sent to [facility].

## 2023-04-23 DIAGNOSIS — H401133 Primary open-angle glaucoma, bilateral, severe stage: Secondary | ICD-10-CM | POA: Diagnosis not present

## 2023-04-30 DIAGNOSIS — H2513 Age-related nuclear cataract, bilateral: Secondary | ICD-10-CM | POA: Diagnosis not present

## 2023-04-30 DIAGNOSIS — H401133 Primary open-angle glaucoma, bilateral, severe stage: Secondary | ICD-10-CM | POA: Diagnosis not present

## 2023-04-30 DIAGNOSIS — H353131 Nonexudative age-related macular degeneration, bilateral, early dry stage: Secondary | ICD-10-CM | POA: Diagnosis not present

## 2023-05-09 DIAGNOSIS — R972 Elevated prostate specific antigen [PSA]: Secondary | ICD-10-CM | POA: Diagnosis not present

## 2023-05-09 DIAGNOSIS — Z8546 Personal history of malignant neoplasm of prostate: Secondary | ICD-10-CM | POA: Diagnosis not present

## 2023-06-08 DIAGNOSIS — R9721 Rising PSA following treatment for malignant neoplasm of prostate: Secondary | ICD-10-CM | POA: Diagnosis not present

## 2023-06-08 DIAGNOSIS — Z8546 Personal history of malignant neoplasm of prostate: Secondary | ICD-10-CM | POA: Diagnosis not present

## 2023-06-08 DIAGNOSIS — C61 Malignant neoplasm of prostate: Secondary | ICD-10-CM | POA: Diagnosis not present

## 2023-06-18 DIAGNOSIS — R972 Elevated prostate specific antigen [PSA]: Secondary | ICD-10-CM | POA: Diagnosis not present

## 2023-06-18 DIAGNOSIS — C61 Malignant neoplasm of prostate: Secondary | ICD-10-CM | POA: Diagnosis not present

## 2023-06-25 DIAGNOSIS — C61 Malignant neoplasm of prostate: Secondary | ICD-10-CM | POA: Diagnosis not present

## 2023-06-26 ENCOUNTER — Other Ambulatory Visit: Payer: Self-pay

## 2023-06-26 MED ORDER — ROSUVASTATIN CALCIUM 10 MG PO TABS: ORAL_TABLET | ORAL | 1 refills | Status: DC

## 2023-07-04 DIAGNOSIS — Z8546 Personal history of malignant neoplasm of prostate: Secondary | ICD-10-CM | POA: Diagnosis not present

## 2023-07-04 DIAGNOSIS — R972 Elevated prostate specific antigen [PSA]: Secondary | ICD-10-CM | POA: Diagnosis not present

## 2023-07-25 DIAGNOSIS — C61 Malignant neoplasm of prostate: Secondary | ICD-10-CM | POA: Diagnosis not present

## 2023-07-29 DIAGNOSIS — R972 Elevated prostate specific antigen [PSA]: Secondary | ICD-10-CM | POA: Diagnosis not present

## 2023-07-29 DIAGNOSIS — Z8546 Personal history of malignant neoplasm of prostate: Secondary | ICD-10-CM | POA: Diagnosis not present

## 2023-08-13 DIAGNOSIS — D2262 Melanocytic nevi of left upper limb, including shoulder: Secondary | ICD-10-CM | POA: Diagnosis not present

## 2023-08-13 DIAGNOSIS — D2261 Melanocytic nevi of right upper limb, including shoulder: Secondary | ICD-10-CM | POA: Diagnosis not present

## 2023-08-13 DIAGNOSIS — Z85828 Personal history of other malignant neoplasm of skin: Secondary | ICD-10-CM | POA: Diagnosis not present

## 2023-08-13 DIAGNOSIS — D2272 Melanocytic nevi of left lower limb, including hip: Secondary | ICD-10-CM | POA: Diagnosis not present

## 2023-08-13 DIAGNOSIS — D225 Melanocytic nevi of trunk: Secondary | ICD-10-CM | POA: Diagnosis not present

## 2023-08-13 DIAGNOSIS — L570 Actinic keratosis: Secondary | ICD-10-CM | POA: Diagnosis not present

## 2023-08-15 DIAGNOSIS — C61 Malignant neoplasm of prostate: Secondary | ICD-10-CM | POA: Diagnosis not present

## 2023-08-20 ENCOUNTER — Telehealth: Payer: Self-pay | Admitting: Internal Medicine

## 2023-08-20 DIAGNOSIS — Z8546 Personal history of malignant neoplasm of prostate: Secondary | ICD-10-CM

## 2023-08-20 DIAGNOSIS — R739 Hyperglycemia, unspecified: Secondary | ICD-10-CM

## 2023-08-20 DIAGNOSIS — E7800 Pure hypercholesterolemia, unspecified: Secondary | ICD-10-CM

## 2023-08-20 NOTE — Telephone Encounter (Signed)
 Lab order needed

## 2023-08-21 NOTE — Telephone Encounter (Signed)
Order placed for future labs

## 2023-08-21 NOTE — Addendum Note (Signed)
 Addended by: GLENDIA ALLENA RAMAN on: 08/21/2023 08:42 PM   Modules accepted: Orders

## 2023-08-21 NOTE — Telephone Encounter (Signed)
Orders placed for future labs.  

## 2023-08-26 ENCOUNTER — Other Ambulatory Visit (INDEPENDENT_AMBULATORY_CARE_PROVIDER_SITE_OTHER)

## 2023-08-26 DIAGNOSIS — R739 Hyperglycemia, unspecified: Secondary | ICD-10-CM

## 2023-08-26 DIAGNOSIS — E7800 Pure hypercholesterolemia, unspecified: Secondary | ICD-10-CM | POA: Diagnosis not present

## 2023-08-26 LAB — BASIC METABOLIC PANEL WITH GFR
BUN: 16 mg/dL (ref 6–23)
CO2: 30 meq/L (ref 19–32)
Calcium: 9 mg/dL (ref 8.4–10.5)
Chloride: 99 meq/L (ref 96–112)
Creatinine, Ser: 0.86 mg/dL (ref 0.40–1.50)
GFR: 90.16 mL/min (ref >60.00)
Glucose, Bld: 110 mg/dL — ABNORMAL HIGH (ref 70–99)
Potassium: 4.2 meq/L (ref 3.5–5.1)
Sodium: 136 meq/L (ref 135–145)

## 2023-08-26 LAB — HEPATIC FUNCTION PANEL
ALT: 26 U/L (ref 0–53)
AST: 21 U/L (ref 0–37)
Albumin: 4.2 g/dL (ref 3.5–5.2)
Alkaline Phosphatase: 72 U/L (ref 39–117)
Bilirubin, Direct: 0.1 mg/dL (ref 0.0–0.3)
Total Bilirubin: 0.6 mg/dL (ref 0.2–1.2)
Total Protein: 6.6 g/dL (ref 6.0–8.3)

## 2023-08-26 LAB — HEMOGLOBIN A1C: Hgb A1c MFr Bld: 6.2 % (ref 4.6–6.5)

## 2023-08-26 LAB — LIPID PANEL
Cholesterol: 127 mg/dL (ref 0–200)
HDL: 40.3 mg/dL (ref >39.00)
LDL Cholesterol: 67 mg/dL (ref 0–99)
NonHDL: 86.88
Total CHOL/HDL Ratio: 3
Triglycerides: 98 mg/dL (ref 0.0–149.0)
VLDL: 19.6 mg/dL (ref 0.0–40.0)

## 2023-08-26 LAB — TSH: TSH: 3.46 u[IU]/mL (ref 0.35–5.50)

## 2023-08-27 ENCOUNTER — Encounter: Payer: Self-pay | Admitting: Internal Medicine

## 2023-08-27 ENCOUNTER — Ambulatory Visit: Payer: Self-pay | Admitting: Internal Medicine

## 2023-08-27 ENCOUNTER — Ambulatory Visit (INDEPENDENT_AMBULATORY_CARE_PROVIDER_SITE_OTHER): Admitting: Internal Medicine

## 2023-08-27 VITALS — BP 138/68 | HR 67 | Temp 97.9°F | Ht 68.0 in | Wt 203.2 lb

## 2023-08-27 DIAGNOSIS — Z8546 Personal history of malignant neoplasm of prostate: Secondary | ICD-10-CM | POA: Diagnosis not present

## 2023-08-27 DIAGNOSIS — R739 Hyperglycemia, unspecified: Secondary | ICD-10-CM | POA: Diagnosis not present

## 2023-08-27 DIAGNOSIS — Z1211 Encounter for screening for malignant neoplasm of colon: Secondary | ICD-10-CM | POA: Diagnosis not present

## 2023-08-27 DIAGNOSIS — E7800 Pure hypercholesterolemia, unspecified: Secondary | ICD-10-CM

## 2023-08-27 NOTE — Progress Notes (Signed)
 Subjective:    [name], male    DOB: 13-Jan-1956, 68 y.o.   MRN: [id number]  Patient here for  Chief Complaint  Patient presents with   Medical Management of Chronic Issues    Follow up    HPI Here for a scheduled follow up - follow up regarding hypercholesterolemia and hyperglycemia. Being followed by urology for f/u prostate cancer. S/p XRT. Doing well. Energy ok. No chest pain or sob reported. No abdominal pain or bowel change reported.    Past Medical History:  Diagnosis Date   Allergy    Glaucoma    Hyperlipidemia    Prostate CA [facility])    Past Surgical History:  Procedure Laterality Date   BACK SURGERY  03/02/04   GLAUCOMA SURGERY Right 2012   Family History  Problem Relation Age of Onset   Hyperlipidemia Mother    Prostate cancer Father    Bone cancer Father    Social History   Socioeconomic History   Marital status: Married    Spouse name: Not on file   Number of children: Not on file   Years of education: Not on file   Highest education level: 12th grade  Occupational History   Not on file  Tobacco Use   Smoking status: Never   Smokeless tobacco: Never  Vaping Use   Vaping status: Never Used  Substance and Sexual Activity   Alcohol use: No    Alcohol/week: 0.0 standard drinks of alcohol   Drug use: No   Sexual activity: Not on file  Other Topics Concern   Not on file  Social History Narrative   Married with kids   Social Drivers of Health   Financial Resource Strain: Low Risk  (08/26/2023)   Overall Financial Resource Strain (CARDIA)    Difficulty of Paying Living Expenses: Not hard at all  Food Insecurity: No Food Insecurity (08/26/2023)   Hunger Vital Sign    Worried About Running Out of Food in the Last Year: Never true    Ran Out of Food in the Last Year: Never true  Transportation Needs: No Transportation Needs (08/26/2023)   PRAPARE - Administrator, Civil Service (Medical): No    Lack of  Transportation (Non-Medical): No  Physical Activity: Insufficiently Active (08/26/2023)   Exercise Vital Sign    Days of Exercise per Week: 4 days    Minutes of Exercise per Session: 30 min  Stress: No Stress Concern Present (08/26/2023)   [employer] of Occupational Health - Occupational Stress Questionnaire    Feeling of Stress: Not at all  Social Connections: Socially Integrated (08/26/2023)   Social Connection and Isolation Panel    Frequency of Communication with Friends and Family: Not on file    Frequency of Social Gatherings with Friends and Family: More than three times a week    Attends Religious Services: More than 4 times per year    Active Member of [employer] or Organizations: Yes    Attends Banker Meetings: 1 to 4 times per year    Marital Status: Married     Review of Systems  Constitutional:  Negative for appetite change and unexpected weight change.  HENT:  Negative for congestion and sinus pressure.   Respiratory:  Negative for cough, chest tightness and shortness of breath.   Cardiovascular:  Negative for chest pain, palpitations and leg swelling.  Gastrointestinal:  Negative for abdominal pain, diarrhea, nausea and vomiting.  Genitourinary:  Negative  for difficulty urinating and dysuria.  Musculoskeletal:  Negative for joint swelling and myalgias.  Skin:  Negative for color change and rash.  Neurological:  Negative for dizziness and headaches.  Psychiatric/Behavioral:  Negative for agitation and dysphoric mood.        Objective:     BP 138/68 (BP Location: Left Arm, Patient Position: Sitting, Cuff Size: Normal)   Pulse 67   Temp 97.9 F (36.6 C) (Oral)   Ht 5' 8 (1.727 m)   Wt 203 lb 3.2 oz (92.2 kg)   SpO2 97%   BMI 30.90 kg/m  Wt Readings from Last 3 Encounters:  08/27/23 203 lb 3.2 oz (92.2 kg)  03/04/23 193 lb (87.5 kg)  10/25/22 193 lb 9.6 oz (87.8 kg)    Physical Exam Constitutional:      General: He is not in acute  distress.    Appearance: Normal appearance. He is well-developed.  HENT:     Head: Normocephalic and atraumatic.     Right Ear: External ear normal.     Left Ear: External ear normal.     Mouth/Throat:     Pharynx: No oropharyngeal exudate or posterior oropharyngeal erythema.  Eyes:     General: No scleral icterus.       Right eye: No discharge.        Left eye: No discharge.  Cardiovascular:     Rate and Rhythm: Normal rate and regular rhythm.  Pulmonary:     Effort: Pulmonary effort is normal. No respiratory distress.     Breath sounds: Normal breath sounds.  Abdominal:     General: Bowel sounds are normal.     Palpations: Abdomen is soft.     Tenderness: There is no abdominal tenderness.  Musculoskeletal:        General: No swelling or tenderness.     Cervical back: Neck supple. No tenderness.  Lymphadenopathy:     Cervical: No cervical adenopathy.  Skin:    Findings: No erythema or rash.  Neurological:     Mental Status: He is alert.  Psychiatric:        Mood and Affect: Mood normal.        Behavior: Behavior normal.         Outpatient Encounter Medications as of 08/27/2023  Medication Sig   Ascorbic Acid (VITAMIN C PO) Take by mouth.   dorzolamide-timolol (COSOPT) 22.3-6.8 MG/ML ophthalmic solution    fexofenadine (ALLEGRA) 180 MG tablet Take 180 mg by mouth daily.   ipratropium (ATROVENT ) 0.03 % nasal spray Place 2 sprays into both nostrils every 12 (twelve) hours.   latanoprost (XALATAN) 0.005 % ophthalmic solution 1 drop at bedtime.   rosuvastatin  (CRESTOR ) 10 MG tablet TAKE 1 TABLET DAILY   VITAMIN D PO Take by mouth.   No facility-administered encounter medications on file as of 08/27/2023.     Lab Results  Component Value Date   WBC 5.1 10/23/2022   HGB 13.6 10/23/2022   HCT 40.5 10/23/2022   PLT 178.0 10/23/2022   GLUCOSE 110 (H) 08/26/2023   CHOL 127 08/26/2023   TRIG 98.0 08/26/2023   HDL 40.30 08/26/2023   LDLCALC 67 08/26/2023   ALT 26  08/26/2023   AST 21 08/26/2023   NA 136 08/26/2023   K 4.2 08/26/2023   CL 99 08/26/2023   CREATININE 0.86 08/26/2023   BUN 16 08/26/2023   CO2 30 08/26/2023   TSH 3.46 08/26/2023   PSA 1.55 06/05/2021   INR 1.0 12/19/2012  HGBA1C 6.2 08/26/2023    NM PET (PSMA) SKULL TO MID THIGH Result Date: 03/31/2023 CLINICAL DATA:  Prostate carcinoma with biochemical recurrence. Diagnosis 2017 with Gleason 3+4=7 adenocarcinoma. Prior HIFU therapy. EXAM: NUCLEAR MEDICINE PET SKULL BASE TO THIGH TECHNIQUE: 8.74 mCi Flotufolastat (Posluma ) was injected intravenously. Full-ring PET imaging was performed from the skull base to thigh after the radiotracer. CT data was obtained and used for attenuation correction and anatomic localization. COMPARISON:  None Available. FINDINGS: NECK No radiotracer activity in neck lymph nodes. Incidental CT finding: None. CHEST No radiotracer accumulation within mediastinal or hilar lymph nodes. No suspicious pulmonary nodules on the CT scan. Mild radiotracer activity associated with bilateral small axillary lymph nodes is consistent with benign nonspecific activity. Incidental CT finding: None. ABDOMEN/PELVIS Prostate: Small focus of radiotracer activity in the posterior LEFT lobe of the prostate gland with SUV max equal 5.2 on image 163. Lymph nodes: No abnormal radiotracer accumulation within pelvic or abdominal nodes. Liver: There is a focus of radiotracer activity centrally within the liver on image 87 which is greater than background liver activity. No corresponding lesion on comparison noncontrast CT. Incidental CT finding: None. SKELETON No focal activity to suggest skeletal metastasis. IMPRESSION: 1. Small focus of radiotracer activity in the posterior LEFT lobe of the prostate gland is concerning for local prostate adenocarcinoma recurrence. 2. No evidence of metastatic adenopathy in the pelvis or periaortic retroperitoneum. 3. No evidence of visceral metastasis or skeletal  metastasis. 4. Focus of radiotracer activity centrally within the liver is indeterminate. No corresponding lesion on comparison noncontrast CT. Unlikely solitary liver metastasis with no additional evidence of metastatic prostate carcinoma. Electronically Signed   By: Jackquline Boxer M.D.   On: 03/31/2023 14:06       Assessment & Plan:  History of prostate cancer Assessment & Plan: Being followed by urology. S/p XRT. Doing well.    Hypercholesterolemia Assessment & Plan: Continue crestor . Low cholesterol diet and exercise. Follow lipid panel.    Hyperglycemia Assessment & Plan: Low carb diet and exercise. Follow met b and A1c.       Allena Hamilton, MD

## 2023-08-27 NOTE — Progress Notes (Unsigned)
 Subjective:    [name], male    DOB: March 01, 1955, 68 y.o.   MRN: [id number]  Patient here for  Chief Complaint  Patient presents with   Medical Management of Chronic Issues    Follow up    HPI Here for a scheduled follow up - follow up regarding hypercholesterolemia and hyperglycemia. Being followed by urology for f/u prostate cancer. XRT.    Past Medical History:  Diagnosis Date   Allergy    Glaucoma    Hyperlipidemia    Prostate CA [facility])    Past Surgical History:  Procedure Laterality Date   BACK SURGERY  03/02/04   GLAUCOMA SURGERY Right 2012   Family History  Problem Relation Age of Onset   Hyperlipidemia Mother    Prostate cancer Father    Bone cancer Father    Social History   Socioeconomic History   Marital status: Married    Spouse name: Not on file   Number of children: Not on file   Years of education: Not on file   Highest education level: 12th grade  Occupational History   Not on file  Tobacco Use   Smoking status: Never   Smokeless tobacco: Never  Vaping Use   Vaping status: Never Used  Substance and Sexual Activity   Alcohol use: No    Alcohol/week: 0.0 standard drinks of alcohol   Drug use: No   Sexual activity: Not on file  Other Topics Concern   Not on file  Social History Narrative   Married with kids   Social Drivers of Health   Financial Resource Strain: Low Risk  (08/26/2023)   Overall Financial Resource Strain (CARDIA)    Difficulty of Paying Living Expenses: Not hard at all  Food Insecurity: No Food Insecurity (08/26/2023)   Hunger Vital Sign    Worried About Running Out of Food in the Last Year: Never true    Ran Out of Food in the Last Year: Never true  Transportation Needs: No Transportation Needs (08/26/2023)   PRAPARE - Administrator, Civil Service (Medical): No    Lack of Transportation (Non-Medical): No  Physical Activity: Insufficiently Active (08/26/2023)   Exercise Vital Sign     Days of Exercise per Week: 4 days    Minutes of Exercise per Session: 30 min  Stress: No Stress Concern Present (08/26/2023)   [employer] of Occupational Health - Occupational Stress Questionnaire    Feeling of Stress: Not at all  Social Connections: Socially Integrated (08/26/2023)   Social Connection and Isolation Panel    Frequency of Communication with Friends and Family: Not on file    Frequency of Social Gatherings with Friends and Family: More than three times a week    Attends Religious Services: More than 4 times per year    Active Member of [employer] or Organizations: Yes    Attends Banker Meetings: 1 to 4 times per year    Marital Status: Married     Review of Systems     Objective:     BP 138/68 (BP Location: Left Arm, Patient Position: Sitting, Cuff Size: Normal)   Pulse 67   Temp 97.9 F (36.6 C) (Oral)   Ht 5' 8 (1.727 m)   Wt 203 lb 3.2 oz (92.2 kg)   SpO2 97%   BMI 30.90 kg/m  Wt Readings from Last 3 Encounters:  08/27/23 203 lb 3.2 oz (92.2 kg)  03/04/23 193 lb (87.5  kg)  10/25/22 193 lb 9.6 oz (87.8 kg)    Physical Exam  {Perform Simple Foot Exam  Perform Detailed exam:1} {Insert foot Exam (Optional):30965}   Outpatient Encounter Medications as of 08/27/2023  Medication Sig   Ascorbic Acid (VITAMIN C PO) Take by mouth.   dorzolamide-timolol (COSOPT) 22.3-6.8 MG/ML ophthalmic solution    fexofenadine (ALLEGRA) 180 MG tablet Take 180 mg by mouth daily.   ipratropium (ATROVENT ) 0.03 % nasal spray Place 2 sprays into both nostrils every 12 (twelve) hours.   latanoprost (XALATAN) 0.005 % ophthalmic solution 1 drop at bedtime.   rosuvastatin  (CRESTOR ) 10 MG tablet TAKE 1 TABLET DAILY   VITAMIN D PO Take by mouth.   No facility-administered encounter medications on file as of 08/27/2023.     Lab Results  Component Value Date   WBC 5.1 10/23/2022   HGB 13.6 10/23/2022   HCT 40.5 10/23/2022   PLT 178.0 10/23/2022   GLUCOSE 110 (H)  08/26/2023   CHOL 127 08/26/2023   TRIG 98.0 08/26/2023   HDL 40.30 08/26/2023   LDLCALC 67 08/26/2023   ALT 26 08/26/2023   AST 21 08/26/2023   NA 136 08/26/2023   K 4.2 08/26/2023   CL 99 08/26/2023   CREATININE 0.86 08/26/2023   BUN 16 08/26/2023   CO2 30 08/26/2023   TSH 3.46 08/26/2023   PSA 1.55 06/05/2021   INR 1.0 12/19/2012   HGBA1C 6.2 08/26/2023    NM PET (PSMA) SKULL TO MID THIGH Result Date: 03/31/2023 CLINICAL DATA:  Prostate carcinoma with biochemical recurrence. Diagnosis 2017 with Gleason 3+4=7 adenocarcinoma. Prior HIFU therapy. EXAM: NUCLEAR MEDICINE PET SKULL BASE TO THIGH TECHNIQUE: 8.74 mCi Flotufolastat (Posluma ) was injected intravenously. Full-ring PET imaging was performed from the skull base to thigh after the radiotracer. CT data was obtained and used for attenuation correction and anatomic localization. COMPARISON:  None Available. FINDINGS: NECK No radiotracer activity in neck lymph nodes. Incidental CT finding: None. CHEST No radiotracer accumulation within mediastinal or hilar lymph nodes. No suspicious pulmonary nodules on the CT scan. Mild radiotracer activity associated with bilateral small axillary lymph nodes is consistent with benign nonspecific activity. Incidental CT finding: None. ABDOMEN/PELVIS Prostate: Small focus of radiotracer activity in the posterior LEFT lobe of the prostate gland with SUV max equal 5.2 on image 163. Lymph nodes: No abnormal radiotracer accumulation within pelvic or abdominal nodes. Liver: There is a focus of radiotracer activity centrally within the liver on image 87 which is greater than background liver activity. No corresponding lesion on comparison noncontrast CT. Incidental CT finding: None. SKELETON No focal activity to suggest skeletal metastasis. IMPRESSION: 1. Small focus of radiotracer activity in the posterior LEFT lobe of the prostate gland is concerning for local prostate adenocarcinoma recurrence. 2. No evidence of  metastatic adenopathy in the pelvis or periaortic retroperitoneum. 3. No evidence of visceral metastasis or skeletal metastasis. 4. Focus of radiotracer activity centrally within the liver is indeterminate. No corresponding lesion on comparison noncontrast CT. Unlikely solitary liver metastasis with no additional evidence of metastatic prostate carcinoma. Electronically Signed   By: Jackquline Boxer M.D.   On: 03/31/2023 14:06       Assessment & Plan:  There are no diagnoses linked to this encounter.   Allena Hamilton, MD

## 2023-08-28 DIAGNOSIS — C61 Malignant neoplasm of prostate: Secondary | ICD-10-CM | POA: Diagnosis not present

## 2023-09-01 ENCOUNTER — Encounter: Payer: Self-pay | Admitting: Internal Medicine

## 2023-09-01 NOTE — Assessment & Plan Note (Signed)
Continue crestor.  Low cholesterol diet and exercise.  Follow lipid panel.  

## 2023-09-01 NOTE — Assessment & Plan Note (Signed)
 Have discussed. Notify when agreeable.

## 2023-09-01 NOTE — Assessment & Plan Note (Signed)
 Being followed by urology. S/p XRT. Doing well.

## 2023-09-01 NOTE — Assessment & Plan Note (Signed)
 Low-carb diet and exercise.  Follow met b and A1c.

## 2023-09-06 DIAGNOSIS — C61 Malignant neoplasm of prostate: Secondary | ICD-10-CM | POA: Diagnosis not present

## 2023-09-07 DIAGNOSIS — C61 Malignant neoplasm of prostate: Secondary | ICD-10-CM | POA: Diagnosis not present

## 2023-09-11 DIAGNOSIS — C61 Malignant neoplasm of prostate: Secondary | ICD-10-CM | POA: Diagnosis not present

## 2023-09-12 DIAGNOSIS — C61 Malignant neoplasm of prostate: Secondary | ICD-10-CM | POA: Diagnosis not present

## 2023-09-13 DIAGNOSIS — C61 Malignant neoplasm of prostate: Secondary | ICD-10-CM | POA: Diagnosis not present

## 2023-09-17 DIAGNOSIS — C61 Malignant neoplasm of prostate: Secondary | ICD-10-CM | POA: Diagnosis not present

## 2023-09-17 DIAGNOSIS — Z510 Encounter for antineoplastic radiation therapy: Secondary | ICD-10-CM | POA: Diagnosis not present

## 2023-09-18 DIAGNOSIS — C61 Malignant neoplasm of prostate: Secondary | ICD-10-CM | POA: Diagnosis not present

## 2023-09-19 DIAGNOSIS — C61 Malignant neoplasm of prostate: Secondary | ICD-10-CM | POA: Diagnosis not present

## 2023-09-20 DIAGNOSIS — C61 Malignant neoplasm of prostate: Secondary | ICD-10-CM | POA: Diagnosis not present

## 2023-09-26 DIAGNOSIS — C61 Malignant neoplasm of prostate: Secondary | ICD-10-CM | POA: Diagnosis not present

## 2023-10-03 DIAGNOSIS — C61 Malignant neoplasm of prostate: Secondary | ICD-10-CM | POA: Diagnosis not present

## 2023-10-07 DIAGNOSIS — C61 Malignant neoplasm of prostate: Secondary | ICD-10-CM | POA: Diagnosis not present

## 2023-10-08 DIAGNOSIS — C61 Malignant neoplasm of prostate: Secondary | ICD-10-CM | POA: Diagnosis not present

## 2023-10-09 DIAGNOSIS — C61 Malignant neoplasm of prostate: Secondary | ICD-10-CM | POA: Diagnosis not present

## 2023-10-10 DIAGNOSIS — C61 Malignant neoplasm of prostate: Secondary | ICD-10-CM | POA: Diagnosis not present

## 2023-10-11 DIAGNOSIS — C61 Malignant neoplasm of prostate: Secondary | ICD-10-CM | POA: Diagnosis not present

## 2023-10-14 DIAGNOSIS — C61 Malignant neoplasm of prostate: Secondary | ICD-10-CM | POA: Diagnosis not present

## 2023-10-15 DIAGNOSIS — C61 Malignant neoplasm of prostate: Secondary | ICD-10-CM | POA: Diagnosis not present

## 2023-10-16 DIAGNOSIS — Z510 Encounter for antineoplastic radiation therapy: Secondary | ICD-10-CM | POA: Diagnosis not present

## 2023-10-16 DIAGNOSIS — C61 Malignant neoplasm of prostate: Secondary | ICD-10-CM | POA: Diagnosis not present

## 2023-10-17 DIAGNOSIS — C61 Malignant neoplasm of prostate: Secondary | ICD-10-CM | POA: Diagnosis not present

## 2023-10-18 DIAGNOSIS — C61 Malignant neoplasm of prostate: Secondary | ICD-10-CM | POA: Diagnosis not present

## 2023-10-21 DIAGNOSIS — C61 Malignant neoplasm of prostate: Secondary | ICD-10-CM | POA: Diagnosis not present

## 2023-11-04 DIAGNOSIS — H2513 Age-related nuclear cataract, bilateral: Secondary | ICD-10-CM | POA: Diagnosis not present

## 2023-11-04 DIAGNOSIS — H401133 Primary open-angle glaucoma, bilateral, severe stage: Secondary | ICD-10-CM | POA: Diagnosis not present

## 2023-11-04 DIAGNOSIS — H353131 Nonexudative age-related macular degeneration, bilateral, early dry stage: Secondary | ICD-10-CM | POA: Diagnosis not present

## 2023-12-17 ENCOUNTER — Other Ambulatory Visit: Payer: Self-pay | Admitting: Internal Medicine

## 2023-12-20 ENCOUNTER — Encounter: Payer: Self-pay | Admitting: Internal Medicine

## 2023-12-20 ENCOUNTER — Ambulatory Visit: Admitting: Internal Medicine

## 2023-12-20 VITALS — BP 136/70 | HR 50 | Temp 97.6°F | Ht 68.0 in | Wt 204.0 lb

## 2023-12-20 DIAGNOSIS — R739 Hyperglycemia, unspecified: Secondary | ICD-10-CM

## 2023-12-20 DIAGNOSIS — Z8546 Personal history of malignant neoplasm of prostate: Secondary | ICD-10-CM | POA: Diagnosis not present

## 2023-12-20 DIAGNOSIS — E7800 Pure hypercholesterolemia, unspecified: Secondary | ICD-10-CM | POA: Diagnosis not present

## 2023-12-20 NOTE — Progress Notes (Unsigned)
 Subjective:    [name], male    DOB: May 12, 1955, 69 y.o.   MRN: [id number]  Patient here for  Chief Complaint  Patient presents with   Medical Management of Chronic Issues    F/up from radiation     HPI Here for a scheduled follow up - follow up regarding hypercholesterolemia and hyperglycemia. Being followed by urology for f/u prostate cancer. S/p XRT. Doing well. Feels good. Fatigue is better. No chest pain or sob reported. No abdominal pain or bowel change reported.    Past Medical History:  Diagnosis Date   Allergy    Glaucoma    Hyperlipidemia    Prostate CA [facility])    Past Surgical History:  Procedure Laterality Date   BACK SURGERY  03/02/04   GLAUCOMA SURGERY Right 2012   Family History  Problem Relation Age of Onset   Hyperlipidemia Mother    Prostate cancer Father    Bone cancer Father    Social History   Socioeconomic History   Marital status: Married    Spouse name: Not on file   Number of children: Not on file   Years of education: Not on file   Highest education level: 12th grade  Occupational History   Not on file  Tobacco Use   Smoking status: Never   Smokeless tobacco: Never  Vaping Use   Vaping status: Never Used  Substance and Sexual Activity   Alcohol use: No    Alcohol/week: 0.0 standard drinks of alcohol   Drug use: No   Sexual activity: Not on file  Other Topics Concern   Not on file  Social History Narrative   Married with kids   Social Drivers of Health   Financial Resource Strain: Low Risk  (12/18/2023)   Overall Financial Resource Strain (CARDIA)    Difficulty of Paying Living Expenses: Not hard at all  Food Insecurity: No Food Insecurity (12/18/2023)   Hunger Vital Sign    Worried About Running Out of Food in the Last Year: Never true    Ran Out of Food in the Last Year: Never true  Transportation Needs: No Transportation Needs (12/18/2023)   PRAPARE - Administrator, Civil Service  (Medical): No    Lack of Transportation (Non-Medical): No  Physical Activity: Unknown (12/18/2023)   Exercise Vital Sign    Days of Exercise per Week: 5 days    Minutes of Exercise per Session: Patient declined  Stress: No Stress Concern Present (12/18/2023)   [employer] of Occupational Health - Occupational Stress Questionnaire    Feeling of Stress: Not at all  Social Connections: Socially Integrated (12/18/2023)   Social Connection and Isolation Panel    Frequency of Communication with Friends and Family: More than three times a week    Frequency of Social Gatherings with Friends and Family: Three times a week    Attends Religious Services: More than 4 times per year    Active Member of Clubs or Organizations: Yes    Attends Banker Meetings: More than 4 times per year    Marital Status: Married     Review of Systems  Constitutional:  Negative for appetite change and unexpected weight change.  HENT:  Negative for congestion and sinus pressure.   Respiratory:  Negative for cough, chest tightness and shortness of breath.   Cardiovascular:  Negative for chest pain, palpitations and leg swelling.  Genitourinary:  Negative for difficulty urinating and dysuria.  Neurological:  Negative for dizziness.       Objective:     BP 136/70   Pulse (!) 50   Temp 97.6 F (36.4 C) (Oral)   Ht 5' 8 (1.727 m)   Wt 204 lb (92.5 kg)   SpO2 99%   BMI 31.02 kg/m  Wt Readings from Last 3 Encounters:  12/20/23 204 lb (92.5 kg)  08/27/23 203 lb 3.2 oz (92.2 kg)  03/04/23 193 lb (87.5 kg)    Physical Exam      Outpatient Encounter Medications as of 12/20/2023  Medication Sig   Ascorbic Acid (VITAMIN C PO) Take by mouth.   dorzolamide-timolol (COSOPT) 22.3-6.8 MG/ML ophthalmic solution    fexofenadine (ALLEGRA) 180 MG tablet Take 180 mg by mouth daily.   ipratropium (ATROVENT ) 0.03 % nasal spray Place 2 sprays into both nostrils every 12 (twelve) hours.   latanoprost  (XALATAN) 0.005 % ophthalmic solution 1 drop at bedtime.   rosuvastatin  (CRESTOR ) 10 MG tablet TAKE 1 TABLET DAILY   VITAMIN D PO Take by mouth.   No facility-administered encounter medications on file as of 12/20/2023.     Lab Results  Component Value Date   WBC 5.3 12/20/2023   HGB 14.2 12/20/2023   HCT 41.6 12/20/2023   PLT 215 12/20/2023   GLUCOSE 103 (H) 12/20/2023   CHOL 131 12/20/2023   TRIG 69 12/20/2023   HDL 48 12/20/2023   LDLCALC 69 12/20/2023   ALT 29 12/20/2023   AST 25 12/20/2023   NA 140 12/20/2023   K 4.4 12/20/2023   CL 101 12/20/2023   CREATININE 0.95 12/20/2023   BUN 15 12/20/2023   CO2 27 12/20/2023   TSH 3.46 08/26/2023   PSA 1.55 06/05/2021   INR 1.0 12/19/2012   HGBA1C 5.7 (H) 12/20/2023    NM PET (PSMA) SKULL TO MID THIGH Result Date: 03/31/2023 CLINICAL DATA:  Prostate carcinoma with biochemical recurrence. Diagnosis 2017 with Gleason 3+4=7 adenocarcinoma. Prior HIFU therapy. EXAM: NUCLEAR MEDICINE PET SKULL BASE TO THIGH TECHNIQUE: 8.74 mCi Flotufolastat (Posluma ) was injected intravenously. Full-ring PET imaging was performed from the skull base to thigh after the radiotracer. CT data was obtained and used for attenuation correction and anatomic localization. COMPARISON:  None Available. FINDINGS: NECK No radiotracer activity in neck lymph nodes. Incidental CT finding: None. CHEST No radiotracer accumulation within mediastinal or hilar lymph nodes. No suspicious pulmonary nodules on the CT scan. Mild radiotracer activity associated with bilateral small axillary lymph nodes is consistent with benign nonspecific activity. Incidental CT finding: None. ABDOMEN/PELVIS Prostate: Small focus of radiotracer activity in the posterior LEFT lobe of the prostate gland with SUV max equal 5.2 on image 163. Lymph nodes: No abnormal radiotracer accumulation within pelvic or abdominal nodes. Liver: There is a focus of radiotracer activity centrally within the liver on image  87 which is greater than background liver activity. No corresponding lesion on comparison noncontrast CT. Incidental CT finding: None. SKELETON No focal activity to suggest skeletal metastasis. IMPRESSION: 1. Small focus of radiotracer activity in the posterior LEFT lobe of the prostate gland is concerning for local prostate adenocarcinoma recurrence. 2. No evidence of metastatic adenopathy in the pelvis or periaortic retroperitoneum. 3. No evidence of visceral metastasis or skeletal metastasis. 4. Focus of radiotracer activity centrally within the liver is indeterminate. No corresponding lesion on comparison noncontrast CT. Unlikely solitary liver metastasis with no additional evidence of metastatic prostate carcinoma. Electronically Signed   By: Jackquline Boxer M.D.   On:  03/31/2023 14:06       Assessment & Plan:  Hypercholesterolemia Assessment & Plan: Continue crestor . Follow lipid panel.   Orders: -     CBC with Differential/Platelet -     Basic metabolic panel with GFR -     Hepatic function panel -     Lipid panel  Hyperglycemia Assessment & Plan: Low carb diet and exercise. Follow met b and A1c.   Orders: -     Hemoglobin A1c  History of prostate cancer Assessment & Plan: Being followed by urology. Completed XRT. Doing well. Follow.       Allena Hamilton, MD

## 2023-12-21 ENCOUNTER — Encounter: Payer: Self-pay | Admitting: Internal Medicine

## 2023-12-21 ENCOUNTER — Ambulatory Visit: Payer: Self-pay | Admitting: Internal Medicine

## 2023-12-21 LAB — CBC WITH DIFFERENTIAL/PLATELET
Basophils Absolute: 0 x10E3/uL (ref 0.0–0.2)
Basos: 1 %
EOS (ABSOLUTE): 0.3 x10E3/uL (ref 0.0–0.4)
Eos: 6 %
Hematocrit: 41.6 % (ref 37.5–51.0)
Hemoglobin: 14.2 g/dL (ref 13.0–17.7)
Immature Grans (Abs): 0 x10E3/uL (ref 0.0–0.1)
Immature Granulocytes: 0 %
Lymphocytes Absolute: 1.1 x10E3/uL (ref 0.7–3.1)
Lymphs: 21 %
MCH: 33.2 pg — ABNORMAL HIGH (ref 26.6–33.0)
MCHC: 34.1 g/dL (ref 31.5–35.7)
MCV: 97 fL (ref 79–97)
Monocytes Absolute: 0.5 x10E3/uL (ref 0.1–0.9)
Monocytes: 10 %
Neutrophils Absolute: 3.3 x10E3/uL (ref 1.4–7.0)
Neutrophils: 62 %
Platelets: 215 x10E3/uL (ref 150–450)
RBC: 4.28 x10E6/uL (ref 4.14–5.80)
RDW: 12 % (ref 11.6–15.4)
WBC: 5.3 x10E3/uL (ref 3.4–10.8)

## 2023-12-21 LAB — BASIC METABOLIC PANEL WITH GFR
BUN/Creatinine Ratio: 16 (ref 10–24)
BUN: 15 mg/dL (ref 8–27)
CO2: 27 mmol/L (ref 20–29)
Calcium: 9.8 mg/dL (ref 8.6–10.2)
Chloride: 101 mmol/L (ref 96–106)
Creatinine, Ser: 0.95 mg/dL (ref 0.76–1.27)
Glucose: 103 mg/dL — ABNORMAL HIGH (ref 70–99)
Potassium: 4.4 mmol/L (ref 3.5–5.2)
Sodium: 140 mmol/L (ref 134–144)
eGFR: 88 mL/min/1.73 (ref >59)

## 2023-12-21 LAB — LIPID PANEL
Chol/HDL Ratio: 2.7 ratio (ref 0.0–5.0)
Cholesterol, Total: 131 mg/dL (ref 100–199)
HDL: 48 mg/dL (ref >39)
LDL Chol Calc (NIH): 69 mg/dL (ref 0–99)
Triglycerides: 69 mg/dL (ref 0–149)
VLDL Cholesterol Cal: 14 mg/dL (ref 5–40)

## 2023-12-21 LAB — HEPATIC FUNCTION PANEL
ALT: 29 IU/L (ref 0–44)
AST: 25 IU/L (ref 0–40)
Albumin: 4.6 g/dL (ref 3.9–4.9)
Alkaline Phosphatase: 80 IU/L (ref 47–123)
Bilirubin Total: 0.3 mg/dL (ref 0.0–1.2)
Bilirubin, Direct: 0.11 mg/dL (ref 0.00–0.40)
Total Protein: 7 g/dL (ref 6.0–8.5)

## 2023-12-21 LAB — HEMOGLOBIN A1C
Est. average glucose Bld gHb Est-mCnc: 117 mg/dL
Hgb A1c MFr Bld: 5.7 % — ABNORMAL HIGH (ref 4.8–5.6)

## 2023-12-21 NOTE — Assessment & Plan Note (Signed)
 Being followed by urology. Completed XRT. Doing well. Follow.

## 2023-12-21 NOTE — Assessment & Plan Note (Signed)
 Low-carb diet and exercise.  Follow met b and A1c.

## 2023-12-21 NOTE — Assessment & Plan Note (Signed)
 Continue crestor . Follow lipid panel.

## 2023-12-27 ENCOUNTER — Ambulatory Visit: Admitting: Internal Medicine

## 2024-01-14 ENCOUNTER — Ambulatory Visit (INDEPENDENT_AMBULATORY_CARE_PROVIDER_SITE_OTHER): Admitting: *Deleted

## 2024-01-14 VITALS — BP 139/66 | Ht 68.0 in | Wt 203.0 lb

## 2024-01-14 DIAGNOSIS — Z0000 Encounter for general adult medical examination without abnormal findings: Secondary | ICD-10-CM

## 2024-01-14 NOTE — Progress Notes (Signed)
 "  Chief Complaint  Patient presents with   Medicare Wellness     Subjective:   Jeremy Fischer is a 68 y.o. male who presents for a Medicare Annual Wellness Visit.  Visit info / Clinical Intake: Medicare Wellness Visit Type:: Initial Annual Wellness Visit Persons participating in visit and providing information:: patient Medicare Wellness Visit Mode:: Telephone If telephone:: video declined Since this visit was completed virtually, some vitals may be partially provided or unavailable. Missing vitals are due to the limitations of the virtual format.: Documented vitals are patient reported If Telephone or Video please confirm:: I connected with patient using audio/video enable telemedicine. I verified patient identity with two identifiers, discussed telehealth limitations, and patient agreed to proceed. Patient Location:: Home Provider Location:: Office/Home Interpreter Needed?: No Pre-visit prep was completed: yes AWV questionnaire completed by patient prior to visit?: yes Date:: 01/14/24 Living arrangements:: (Patient-Rptd) lives with spouse/significant other Patient's Overall Health Status Rating: (Patient-Rptd) very good Typical amount of pain: (Patient-Rptd) none Does pain affect daily life?: (Patient-Rptd) no Are you currently prescribed opioids?: no  Dietary Habits and Nutritional Risks How many meals a day?: (Patient-Rptd) 3 Eats fruit and vegetables daily?: (Patient-Rptd) yes Most meals are obtained by: (Patient-Rptd) preparing own meals In the last 2 weeks, have you had any of the following?: none Diabetic:: no  Functional Status Activities of Daily Living (to include ambulation/medication): (Patient-Rptd) Independent Ambulation: (Patient-Rptd) Independent Medication Administration: (Patient-Rptd) Independent Home Management (perform basic housework or laundry): (Patient-Rptd) Independent Manage your own finances?: (Patient-Rptd) yes Primary transportation is:  (Patient-Rptd) driving Concerns about vision?: no *vision screening is required for WTM* Concerns about hearing?: no  Fall Screening Falls in the past year?: (Patient-Rptd) 0 Number of falls in past year: 0 Was there an injury with Fall?: 0 Fall Risk Category Calculator: 0 Patient Fall Risk Level: Low Fall Risk  Fall Risk Patient at Risk for Falls Due to: No Fall Risks Fall risk Follow up: Falls evaluation completed; Falls prevention discussed  Home and Transportation Safety: All rugs have non-skid backing?: (Patient-Rptd) N/A, no rugs All stairs or steps have railings?: (Patient-Rptd) yes Grab bars in the bathtub or shower?: (Patient-Rptd) yes Have non-skid surface in bathtub or shower?: (Patient-Rptd) yes Good home lighting?: (Patient-Rptd) yes Regular seat belt use?: (Patient-Rptd) yes Hospital stays in the last year:: (Patient-Rptd) no  Cognitive Assessment Difficulty concentrating, remembering, or making decisions? : (Patient-Rptd) no Will 6CIT or Mini Cog be Completed: yes What year is it?: 0 points What month is it?: 0 points Give patient an address phrase to remember (5 components): [address] [place] About what time is it?: 0 points Count backwards from 20 to 1: 0 points Say the months of the year in reverse: 0 points Repeat the address phrase from earlier: 0 points 6 CIT Score: 0 points  Advance Directives (For Healthcare) Does Patient Have a Medical Advance Directive?: Yes Does patient want to make changes to medical advance directive?: No - Patient declined Type of Advance Directive: Healthcare Power of Center; Living will Copy of Healthcare Power of Attorney in Chart?: No - copy requested Copy of Living Will in Chart?: No - copy requested  Reviewed/Updated  Reviewed/Updated: Reviewed All (Medical, Surgical, Family, Medications, Allergies, Care Teams, Patient Goals)    Allergies (verified) Iodinated contrast media   Current Medications  (verified) Outpatient Encounter Medications as of 01/14/2024  Medication Sig   Ascorbic Acid (VITAMIN C PO) Take by mouth.   dorzolamide-timolol (COSOPT) 22.3-6.8 MG/ML ophthalmic solution  fexofenadine (ALLEGRA) 180 MG tablet Take 180 mg by mouth daily.   ipratropium (ATROVENT ) 0.03 % nasal spray Place 2 sprays into both nostrils every 12 (twelve) hours.   latanoprost (XALATAN) 0.005 % ophthalmic solution 1 drop at bedtime.   rosuvastatin  (CRESTOR ) 10 MG tablet TAKE 1 TABLET DAILY   VITAMIN D PO Take by mouth.   No facility-administered encounter medications on file as of 01/14/2024.    History: Past Medical History:  Diagnosis Date   Allergy    Glaucoma    Hyperlipidemia    Prostate CA [facility])    Past Surgical History:  Procedure Laterality Date   BACK SURGERY  03/02/04   GLAUCOMA SURGERY Right 2012   Family History  Problem Relation Age of Onset   Hyperlipidemia Mother    Prostate cancer Father    Bone cancer Father    Social History   Occupational History   Not on file  Tobacco Use   Smoking status: Never   Smokeless tobacco: Never  Vaping Use   Vaping status: Never Used  Substance and Sexual Activity   Alcohol use: No    Alcohol/week: 0.0 standard drinks of alcohol   Drug use: No   Sexual activity: Not on file   Tobacco Counseling Counseling given: Not Answered  SDOH Screenings   Food Insecurity: No Food Insecurity (01/14/2024)  Housing: Low Risk (01/14/2024)  Transportation Needs: No Transportation Needs (01/14/2024)  Utilities: Not At Risk (01/14/2024)  Alcohol Screen: Low Risk (01/14/2024)  Depression (PHQ2-9): Low Risk (01/14/2024)  Financial Resource Strain: Low Risk (01/14/2024)  Physical Activity: Inactive (01/14/2024)  Social Connections: Moderately Integrated (01/14/2024)  Stress: No Stress Concern Present (01/14/2024)  Tobacco Use: Low Risk (01/14/2024)  Health Literacy: Adequate Health Literacy (01/14/2024)   See flowsheets for full  screening details  Depression Screen PHQ 2 & 9 Depression Scale- Over the past 2 weeks, how often have you been bothered by any of the following problems? Little interest or pleasure in doing things: 0 Feeling down, depressed, or hopeless (PHQ Adolescent also includes...irritable): 0 PHQ-2 Total Score: 0 Trouble falling or staying asleep, or sleeping too much: 0 Feeling tired or having little energy: 0 Poor appetite or overeating (PHQ Adolescent also includes...weight loss): 0 Feeling bad about yourself - or that you are a failure or have let yourself or your family down: 0 Trouble concentrating on things, such as reading the newspaper or watching television (PHQ Adolescent also includes...like school work): 0 Moving or speaking so slowly that other people could have noticed. Or the opposite - being so fidgety or restless that you have been moving around a lot more than usual: 0 Thoughts that you would be better off dead, or of hurting yourself in some way: 0 PHQ-9 Total Score: 0 If you checked off any problems, how difficult have these problems made it for you to do your work, take care of things at home, or get along with other people?: Not difficult at all     Goals Addressed             This Visit's Progress    Patient Stated       Wants to try to eat right and stay active             Objective:    Today's Vitals   01/14/24 1055 01/14/24 1120  BP: (!) 142/64 139/66  Weight: 203 lb (92.1 kg)   Height: 5' 8 (1.727 m)    Body mass index is  30.87 kg/m.  Hearing/Vision screen Hearing Screening - Comments:: No issues Vision Screening - Comments:: Readers, [facility], Up to date Immunizations and Health Maintenance Health Maintenance  Topic Date Due   Hepatitis C Screening  Never done   Colonoscopy  Never done   Pneumococcal Vaccine: 50+ Years (1 of 1 - PCV) Never done   COVID-19 Vaccine (4 - 2025-26 season) 09/16/2023   Medicare Annual Wellness (AWV)   01/13/2025   DTaP/Tdap/Td (2 - Td or Tdap) 11/06/2026   Influenza Vaccine  Completed   Zoster Vaccines- Shingrix  Completed   Meningococcal B Vaccine  Aged Out        Assessment/Plan:  This is a routine wellness examination for [facility].  Patient Care Team: Glendia Shad, MD as PCP - General (Internal Medicine) Keane, Franky Blunt, MD as Referring Physician (Radiation Oncology) Twylla Glendia BROCKS, MD (Urology)  I have personally reviewed and noted the following in the patients chart:   Medical and social history Use of alcohol, tobacco or illicit drugs  Current medications and supplements including opioid prescriptions. Functional ability and status Nutritional status Physical activity Advanced directives List of other physicians Hospitalizations, surgeries, and ER visits in previous 12 months Vitals Screenings to include cognitive, depression, and falls Referrals and appointments  No orders of the defined types were placed in this encounter.  In addition, I have reviewed and discussed with patient certain preventive protocols, quality metrics, and best practice recommendations. A written personalized care plan for preventive services as well as general preventive health recommendations were provided to patient.   Angeline Fredericks, LPN   [id number]/[id number]   Return in 1 year (on 01/13/2025).  After Visit Summary: (MyChart) Due to this being a telephonic visit, the after visit summary with patients personalized plan was offered to patient via MyChart   Nurse Notes: Patient declines covid vaccine and Hepatitis C screening. Patient stated that he will be talking to his cancer doctor about a colonoscopy at his next visit.  "

## 2024-01-14 NOTE — Patient Instructions (Signed)
 Mr. Jeremy Fischer,  Thank you for taking the time for your Medicare Wellness Visit. I appreciate your continued commitment to your health goals. Please review the care plan we discussed, and feel free to reach out if I can assist you further.  Please note that Annual Wellness Visits do not include a physical exam. Some assessments may be limited, especially if the visit was conducted virtually. If needed, we may recommend an in-person follow-up with your provider.  Ongoing Care Seeing your primary care provider every 3 to 6 months helps us  monitor your health and provide consistent, personalized care.  Remember to consider a colonoscopy and talk with your cancer doctor about it.   Referrals If a referral was made during today's visit and you haven't received any updates within two weeks, please contact the referred provider directly to check on the status.  Recommended Screenings:  Health Maintenance  Topic Date Due   Hepatitis C Screening  Never done   Colon Cancer Screening  Never done   Pneumococcal Vaccine for age over 59 (1 of 1 - PCV) Never done   COVID-19 Vaccine (4 - 2025-26 season) 09/16/2023   Medicare Annual Wellness Visit  01/13/2025   DTaP/Tdap/Td vaccine (2 - Td or Tdap) 11/06/2026   Flu Shot  Completed   Zoster (Shingles) Vaccine  Completed   Meningitis B Vaccine  Aged Out       01/14/2024    8:24 AM  Advanced Directives  Does Patient Have a Medical Advance Directive? Yes  Type of Estate Agent of [place];Living will  Does patient want to make changes to medical advance directive? No - Patient declined  Copy of Healthcare Power of Attorney in Chart? No - copy requested    Vision: Annual vision screenings are recommended for early detection of glaucoma, cataracts, and diabetic retinopathy. These exams can also reveal signs of chronic conditions such as diabetes and high blood pressure.  Dental: Annual dental screenings help detect early signs of  oral cancer, gum disease, and other conditions linked to overall health, including heart disease and diabetes.  Please see the attached documents for additional preventive care recommendations.

## 2024-02-05 ENCOUNTER — Ambulatory Visit
Admission: EM | Admit: 2024-02-05 | Discharge: 2024-02-05 | Disposition: A | Discharge status: Home / Self Care | Attending: Emergency Medicine | Admitting: Emergency Medicine

## 2024-02-05 ENCOUNTER — Ambulatory Visit: Payer: Self-pay | Admitting: *Deleted

## 2024-02-05 ENCOUNTER — Encounter: Payer: Self-pay | Admitting: Emergency Medicine

## 2024-02-05 DIAGNOSIS — J0110 Acute frontal sinusitis, unspecified: Secondary | ICD-10-CM

## 2024-02-05 DIAGNOSIS — M542 Cervicalgia: Secondary | ICD-10-CM

## 2024-02-05 MED ORDER — PREDNISONE 10 MG (21) PO TBPK: ORAL_TABLET | Freq: Every day | ORAL | 0 refills | Status: AC

## 2024-02-05 MED ORDER — AZITHROMYCIN 250 MG PO TABS: 250.0000 mg | ORAL_TABLET | Freq: Every day | ORAL | 0 refills | Status: AC

## 2024-02-05 NOTE — Discharge Instructions (Addendum)
 Begin Azithromycin  for coverage of a sinus infection  Begin prednisone  every morning with food as directed to help with neck pain as well as sinus pressure  You can take Tylenol  as needed for fever reduction and pain relief.   For cough: honey 1/2 to 1 teaspoon (you can dilute the honey in water or another fluid).  You can also use guaifenesin and dextromethorphan for cough. You can use a humidifier for chest congestion and cough.  If you don't have a humidifier, you can sit in the bathroom with the hot shower running.      For sore throat: try warm salt water gargles, cepacol lozenges, throat spray, warm tea or water with lemon/honey, popsicles or ice, or OTC cold relief medicine for throat discomfort.   For congestion: take a daily anti-histamine like Zyrtec , Claritin, and a oral decongestant, such as pseudoephedrine.  You can also use Flonase  1-2 sprays in each nostril daily.   It is important to stay hydrated: drink plenty of fluids (water, gatorade/powerade/pedialyte, juices, or teas) to keep your throat moisturized and help further relieve irritation/discomfort.

## 2024-02-05 NOTE — ED Triage Notes (Signed)
 Patient reports nasal drainage with yellow mucus x 1 week ago. Patient reports right sided neck pain that started on Sunday. Patient denies injury. Rates pain 10/10. Patient has taken Ibuprofen with mild relief. Last dose of Ibuprofen was at 9:00 pm.

## 2024-02-05 NOTE — ED Provider Notes (Signed)
 " Jeremy Fischer    CSN: [id number] Arrival date & time: 02/05/24  0941      History   Chief Complaint Chief Complaint  Patient presents with   Torticollis   Nasal Congestion    HPI Jeremy Fischer is a 69 y.o. male.   Patient presents for evaluation of nasal congestion, sinus pressure to the forehead, left-sided ear fullness and a productive cough with yellow sputum present for 7 days.  Over the past 3 days has began to experience right sided neck pain exacerbated with lateral turn, denies injury or trauma, numbness or tingling.  Has attempted use of ibuprofen with minimal improvement.  Known sick contact with exposure to COVID 3 weeks ago in household.  Has completed home testing which was negative.     Past Medical History:  Diagnosis Date   Allergy    Glaucoma    Hyperlipidemia    Prostate CA [facility])     Patient Active Problem List   Diagnosis Date Noted   Acute right flank pain 04/27/2022   GERD (gastroesophageal reflux disease) 05/28/2021   Colon cancer screening 02/21/2020   Hypercholesterolemia 04/01/2018   Acute non-recurrent sinusitis 05/01/2017   History of prostate cancer 04/24/2015   Pre-op evaluation 04/24/2015   Health care maintenance 10/24/2014   Rash 10/24/2014   Elevated blood pressure reading 10/18/2013   Abnormal CT scan, neck 05/10/2013   Hyperglycemia 05/10/2013   Benign prostatic hyperplasia 10/22/2012   Glaucoma 10/15/2012   Environmental allergies 10/15/2012    Past Surgical History:  Procedure Laterality Date   BACK SURGERY  03/02/04   GLAUCOMA SURGERY Right 2012       Home Medications    Prior to Admission medications  Medication Sig Start Date End Date Taking? Authorizing Provider  Ascorbic Acid (VITAMIN C PO) Take by mouth.    [provider]  dorzolamide-timolol (COSOPT) 22.3-6.8 MG/ML ophthalmic solution  11/03/12   [provider]  fexofenadine (ALLEGRA) 180 MG tablet Take 180 mg by mouth daily.     [provider]  ipratropium (ATROVENT ) 0.03 % nasal spray Place 2 sprays into both nostrils every 12 (twelve) hours. 09/24/22   Airen Dales R, NP  latanoprost (XALATAN) 0.005 % ophthalmic solution 1 drop at bedtime. 04/07/21   [provider]  rosuvastatin  (CRESTOR ) 10 MG tablet TAKE 1 TABLET DAILY 12/17/23   Glendia Shad, MD  VITAMIN D PO Take by mouth.    [provider]    Family History Family History  Problem Relation Age of Onset   Hyperlipidemia Mother    Prostate cancer Father    Bone cancer Father     Social History Social History[1]   Allergies   Iodinated contrast media   Review of Systems Review of Systems  Constitutional: Negative.   HENT:  Positive for congestion, sinus pressure and trouble swallowing. Negative for dental problem, drooling, ear discharge, ear pain, facial swelling, hearing loss, mouth sores, nosebleeds, postnasal drip, rhinorrhea, sinus pain, sneezing, sore throat, tinnitus and voice change.   Respiratory:  Positive for cough. Negative for apnea, choking, chest tightness, shortness of breath, wheezing and stridor.      Physical Exam Triage Vital Signs ED Triage Vitals  Encounter Vitals Group     BP 02/05/24 1031 (!) 157/73     Girls Systolic BP Percentile --      Girls Diastolic BP Percentile --      Boys Systolic BP Percentile --      Boys  Diastolic BP Percentile --      Pulse Rate 02/05/24 1031 60     Resp 02/05/24 1031 20     Temp 02/05/24 1031 98.2 F (36.8 C)     Temp Source 02/05/24 1031 Oral     SpO2 02/05/24 1031 95 %     Weight --      Height --      Head Circumference --      Peak Flow --      Pain Score 02/05/24 1034 10     Pain Loc --      Pain Education --      Exclude from Growth Chart --    No data found.  Updated Vital Signs BP (!) 157/73 (BP Location: Right Arm)   Pulse 60   Temp 98.2 F (36.8 C) (Oral)   Resp 20   SpO2 95%   Visual Acuity Right Eye Distance:   Left Eye  Distance:   Bilateral Distance:    Right Eye Near:   Left Eye Near:    Bilateral Near:     Physical Exam Constitutional:      Appearance: Normal appearance.  HENT:     Head: Normocephalic.     Right Ear: Tympanic membrane, ear canal and external ear normal.     Left Ear: Tympanic membrane, ear canal and external ear normal.     Nose: Congestion present.     Mouth/Throat:     Pharynx: No oropharyngeal exudate or posterior oropharyngeal erythema.  Eyes:     Extraocular Movements: Extraocular movements intact.  Neck:     Comments: Tenderness to the right lateral aspect of the neck, no spinal tenderness noted, able to complete full range of motion pain elicited with all movement of the neck, no rigidity or crepitus present, 2+ carotid pulses Cardiovascular:     Rate and Rhythm: Normal rate and regular rhythm.     Pulses: Normal pulses.     Heart sounds: Normal heart sounds.  Pulmonary:     Effort: Pulmonary effort is normal.     Breath sounds: Normal breath sounds.  Skin:    General: Skin is warm and dry.  Neurological:     Mental Status: He is alert and oriented to person, place, and time.      UC Treatments / Results  Labs (all labs ordered are listed, but only abnormal results are displayed) Labs Reviewed - No data to display  EKG   Radiology No results found.  Procedures Procedures (including critical care time)  Medications Ordered in UC Medications - No data to display  Initial Impression / Assessment and Plan / UC Course  I have reviewed the triage vital signs and the nursing notes.  Pertinent labs & imaging results that were available during my care of the patient were reviewed by me and considered in my medical decision making (see chart for details).  Acute non recurrent frontal sinusitis, Neck pain on right side  Patient is in no signs of distress nor toxic appearing.  Vital signs are stable.  Low suspicion for pneumonia, pneumothorax or bronchitis  and therefore will defer imaging. Neck pain seems muscular, will move forward with treatment as such. Prescribed azithromycin  and prednisone . May use additional over-the-counter medications as needed for supportive care.  May follow-up with urgent care as needed if symptoms persist or worsen.  Final Clinical Impressions(s) / UC Diagnoses   Final diagnoses:  None   Discharge Instructions   None  ED Prescriptions   None    PDMP not reviewed this encounter.     [1]  Social History Tobacco Use   Smoking status: Never   Smokeless tobacco: Never  Vaping Use   Vaping status: Never Used  Substance Use Topics   Alcohol use: No    Alcohol/week: 0.0 standard drinks of alcohol   Drug use: No     Teresa Shelba SAUNDERS, NP 02/05/24 1212  "

## 2024-02-05 NOTE — Telephone Encounter (Signed)
 FYI Only or Action Required?: Action required by provider: request for appointment, update on patient condition, and recommended UC due to no available appt today as requested.  Patient was last seen in primary care on 12/20/2023 by Glendia Shad, MD.  Called Nurse Triage reporting Neck Pain.  Symptoms began several days ago.  Interventions attempted: OTC medications: ibuprofen  and Rest, hydration, or home remedies.  Symptoms are: gradually worsening.  Triage Disposition: See PCP When Office is Open (Within 3 Days)  Patient/caregiver understands and will follow disposition?: Yes                 Reason for Disposition  [1] MODERATE neck pain (e.g., interferes with normal activities) AND [2] present > 3 days  Answer Assessment - Initial Assessment Questions No available appt until Friday . Offered appt and patient reports moderate pain but hurting and does not want to wait until Friday. Recommended UC for earlier evaluation. Please advise if patient can be worked in today .      1. ONSET: When did the pain begin?      3 days ago  2. LOCATION: Where does it hurt?      Right side  3. PATTERN Does the pain come and go, or has it been constant since it started?      Constant and worsening turning head 4. SEVERITY: How bad is the pain?  (Scale 0-10; or none or slight stiffness, mild, moderate, severe)     Sharp with looking up or to the side  5. RADIATION: Does the pain go anywhere else, shoot into your arms?     No  6. CORD SYMPTOMS: Any weakness or numbness of the arms or legs?     no 7. CAUSE: What do you think is causing the neck pain?     Not sure  8. NECK OVERUSE: Any recent activities that involved turning or twisting the neck?     no 9. OTHER SYMPTOMS: Do you have any other symptoms? (e.g., headache, fever, chest pain, difficulty breathing, neck swelling)     Right neck pain with turning head, mild swelling , cough congestion, sinus  pressure.  10. PREGNANCY: Is there any chance you are pregnant? When was your last menstrual period?       na  Protocols used: Neck Pain or Stiffness-A-AH

## 2024-02-06 NOTE — Telephone Encounter (Signed)
 Late entry. Called and discussed his current symptoms. Evaluated. Received prednisone  and abx. Will call with update.

## 2024-02-24 ENCOUNTER — Ambulatory Visit: Admitting: Internal Medicine

## 2024-03-06 ENCOUNTER — Ambulatory Visit: Admitting: Internal Medicine

## 2025-01-18 ENCOUNTER — Ambulatory Visit
